# Patient Record
Sex: Male | Born: 1970 | Race: White | Hispanic: No | Marital: Single | Smoking: Never smoker
Health system: Southern US, Community
[De-identification: ages and names within clinical notes are randomized; demographics above are authoritative.]

## PROBLEM LIST (undated history)

## (undated) DIAGNOSIS — S0292XA Unspecified fracture of facial bones, initial encounter for closed fracture: Secondary | ICD-10-CM

## (undated) DIAGNOSIS — H547 Unspecified visual loss: Secondary | ICD-10-CM

---

## 2006-10-10 DIAGNOSIS — S0292XA Unspecified fracture of facial bones, initial encounter for closed fracture: Secondary | ICD-10-CM

## 2006-10-10 HISTORY — DX: Unspecified fracture of facial bones, initial encounter for closed fracture: S02.92XA

## 2007-06-22 ENCOUNTER — Encounter: Payer: Self-pay | Admitting: Emergency Medicine

## 2007-06-22 ENCOUNTER — Emergency Department (HOSPITAL_COMMUNITY): Admission: EM | Admit: 2007-06-22 | Discharge: 2007-06-22 | Payer: Self-pay | Admitting: Emergency Medicine
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
## 2013-08-24 ENCOUNTER — Emergency Department (HOSPITAL_COMMUNITY)
Admission: EM | Admit: 2013-08-24 | Discharge: 2013-08-24 | Discharge status: Against Medical Advice | Payer: Self-pay | Attending: Emergency Medicine | Admitting: Emergency Medicine

## 2013-08-24 ENCOUNTER — Encounter (HOSPITAL_COMMUNITY): Payer: Self-pay | Admitting: Emergency Medicine

## 2013-08-24 DIAGNOSIS — R220 Localized swelling, mass and lump, head: Secondary | ICD-10-CM | POA: Insufficient documentation

## 2013-08-24 DIAGNOSIS — Y9389 Activity, other specified: Secondary | ICD-10-CM | POA: Insufficient documentation

## 2013-08-24 DIAGNOSIS — Z8669 Personal history of other diseases of the nervous system and sense organs: Secondary | ICD-10-CM | POA: Insufficient documentation

## 2013-08-24 DIAGNOSIS — T63461A Toxic effect of venom of wasps, accidental (unintentional), initial encounter: Secondary | ICD-10-CM | POA: Insufficient documentation

## 2013-08-24 DIAGNOSIS — T6391XA Toxic effect of contact with unspecified venomous animal, accidental (unintentional), initial encounter: Secondary | ICD-10-CM | POA: Insufficient documentation

## 2013-08-24 DIAGNOSIS — Z8781 Personal history of (healed) traumatic fracture: Secondary | ICD-10-CM | POA: Insufficient documentation

## 2013-08-24 DIAGNOSIS — Y929 Unspecified place or not applicable: Secondary | ICD-10-CM | POA: Insufficient documentation

## 2013-08-24 HISTORY — DX: Unspecified visual loss: H54.7

## 2013-08-24 HISTORY — DX: Unspecified fracture of facial bones, initial encounter for closed fracture: S02.92XA

## 2013-08-24 MED ORDER — DIPHENHYDRAMINE HCL 50 MG/ML IJ SOLN
50.0000 mg | Freq: Once | INTRAMUSCULAR | Status: AC
  Administered 2013-08-24: 50 mg via INTRAVENOUS
  Filled 2013-08-24: qty 1

## 2013-08-24 MED ORDER — FAMOTIDINE IN NACL 20-0.9 MG/50ML-% IV SOLN
20.0000 mg | Freq: Once | INTRAVENOUS | Status: AC
  Administered 2013-08-24: 20 mg via INTRAVENOUS
  Filled 2013-08-24: qty 50

## 2013-08-24 MED ORDER — METHYLPREDNISOLONE SODIUM SUCC 125 MG IJ SOLR
125.0000 mg | Freq: Once | INTRAMUSCULAR | Status: AC
  Administered 2013-08-24: 125 mg via INTRAVENOUS
  Filled 2013-08-24: qty 2

## 2013-08-24 MED ORDER — PREDNISONE 20 MG PO TABS: 40.0000 mg | ORAL_TABLET | Freq: Every day | ORAL | Status: DC

## 2013-08-24 NOTE — ED Notes (Signed)
Stung be a ye;llow jacket, facial swelling

## 2013-08-24 NOTE — ED Provider Notes (Signed)
CSN: [id number]     Arrival date & time 08/24/13  1517 History   First MD Initiated Contact with Patient 08/24/13 1535     Chief Complaint  Patient presents with  . Insect Bite    HPI Pt was seen at 1535. Per pt, c/o sudden onset and resolution of one episode of "stung by a bee" on his left upper lip that occurred approx 1 hour PTA. Pt states he was outside cooking and talking on his cellphone when "a bee stung me on my left upper lip." States the swelling has spread to his entire upper lip and left facial cheek." Pt states he took OTC benadryl without improvement. Denies intra-oral edema, no hoarse voice, no drooling, no wheezing/stridor, no SOB/CP, no rash.     Past Medical History  Diagnosis Date  . Decreased vision     right eye  . Facial bones, closed fracture 2008    left tripod fracture    History reviewed. No pertinent past surgical history.  History  Substance Use Topics  . Smoking status: Never Smoker   . Smokeless tobacco: Not on file  . Alcohol Use: No    Review of Systems ROS: Statement: All systems negative except as marked or noted in the HPI; Constitutional: Negative for fever and chills. ; ; Eyes: Negative for eye pain, redness and discharge. ; ; ENMT: Negative for ear pain, hoarseness, nasal congestion, sinus pressure and sore throat. ; ; Cardiovascular: Negative for chest pain, palpitations, diaphoresis, dyspnea and peripheral edema. ; ; Respiratory: Negative for cough, wheezing and stridor. ; ; Gastrointestinal: Negative for nausea, vomiting, diarrhea, abdominal pain, blood in stool, hematemesis, jaundice and rectal bleeding. . ; ; Genitourinary: Negative for dysuria, flank pain and hematuria. ; ; Musculoskeletal: Negative for back pain and neck pain. Negative for swelling and trauma.; ; Skin: +left facial and upper lip swelling. Negative for pruritus, rash, abrasions, blisters, bruising and skin lesion.; ; Neuro: Negative for headache, lightheadedness and neck  stiffness. Negative for weakness, altered level of consciousness , altered mental status, extremity weakness, paresthesias, involuntary movement, seizure and syncope.       Allergies  Bee venom and Sulfa antibiotics  Home Medications  No current outpatient prescriptions on file. BP 156/99  Pulse 98  Temp(Src) 98.6 F (37 C) (Oral)  SpO2 96% Physical Exam: Physical examination:  Nursing notes reviewed; Vital signs and O2 SAT reviewed;  Constitutional: Well developed, Well nourished, Well hydrated, In no acute distress; Head:  Normocephalic, atraumatic; Eyes: EOMI, PERRL, No scleral icterus; ENMT: Mouth and pharynx normal, Mucous membranes moist. Mouth and pharynx without lesions. +entire upper lip and left facial cheek edema. No erythema, no rash, no open wounds. No tonsillar exudates. No intra-oral edema. No submandibular or sublingual edema. No hoarse voice, no drooling, no stridor. No pain with manipulation of larynx.;; Neck: Supple, Full range of motion, No lymphadenopathy; Cardiovascular: Regular rate and rhythm, No gallop; Respiratory: Breath sounds clear & equal bilaterally, No wheezes.  Speaking full sentences with ease, Normal respiratory effort/excursion; Chest: Nontender, Movement normal; Abdomen: Soft, Nontender, Nondistended, Normal bowel sounds; Genitourinary: No CVA tenderness; Extremities: Pulses normal, No tenderness, No edema, No calf edema or asymmetry.; Neuro: AA&Ox3, Major CN grossly intact.  Speech clear. Climbs on and off stretcher easily by himself. Gait steady. No gross focal motor or sensory deficits in extremities.; Skin: Color normal, Warm, Dry.    ED Course  Procedures   EKG Interpretation   None  MDM  MDM Reviewed: previous chart, nursing note and vitals      1815:  Pt does not want to stay in the ED any longer. States he "just has to go home now." Pt received meds approx 2 hours ago without much change in his facial and upper lip edema. No  intra-oral edema, no wheezing, no hoarse voice, no drooling/stridor. Has been ambulatory with steady gait, easy resps. Sats remain 98% R/A. Pt and family informed re: continued facial and lip edema and that I recommend continued ED observation for clinical improvement.  Pt refuses to stay.  I encouraged pt to stay, continues to refuse.  Pt makes his own medical decisions.  Risks of AMA explained to pt and family, including, but not limited to:  Intra-oral edema, airway compromise, stroke, heart attack, cardiac arrythmia ("irregular heart rate/beat"), "passing out," temporary and/or permanent disability, death.  Pt and family verb understanding and continue to refuse admission, understanding the consequences of their decision.  I encouraged pt to follow up with his PMD on Monday and return to the ED immediately if symptoms worsen, or for any other concerns.  Pt and family verb understanding, agreeable.   Laray Anger, DO 08/25/13 [phone]

## 2016-12-01 ENCOUNTER — Encounter (HOSPITAL_COMMUNITY): Payer: Self-pay | Admitting: Emergency Medicine

## 2016-12-01 ENCOUNTER — Emergency Department (HOSPITAL_COMMUNITY)
Admission: EM | Admit: 2016-12-01 | Discharge: 2016-12-01 | Disposition: A | Discharge status: Home / Self Care | Payer: Self-pay | Attending: Emergency Medicine | Admitting: Emergency Medicine

## 2016-12-01 DIAGNOSIS — L03116 Cellulitis of left lower limb: Secondary | ICD-10-CM | POA: Insufficient documentation

## 2016-12-01 DIAGNOSIS — Z7982 Long term (current) use of aspirin: Secondary | ICD-10-CM | POA: Insufficient documentation

## 2016-12-01 DIAGNOSIS — F17220 Nicotine dependence, chewing tobacco, uncomplicated: Secondary | ICD-10-CM | POA: Insufficient documentation

## 2016-12-01 DIAGNOSIS — Z79899 Other long term (current) drug therapy: Secondary | ICD-10-CM | POA: Insufficient documentation

## 2016-12-01 LAB — CBC WITH DIFFERENTIAL/PLATELET
Basophils Absolute: 0 10*3/uL (ref 0.0–0.1)
Basophils Relative: 0 %
Eosinophils Absolute: 0 10*3/uL (ref 0.0–0.7)
Eosinophils Relative: 0 %
HEMATOCRIT: 46.8 % (ref 39.0–52.0)
HEMOGLOBIN: 15.1 g/dL (ref 13.0–17.0)
LYMPHS PCT: 12 %
Lymphs Abs: 2.2 10*3/uL (ref 0.7–4.0)
MCH: 30.6 pg (ref 26.0–34.0)
MCHC: 32.3 g/dL (ref 30.0–36.0)
MCV: 94.7 fL (ref 78.0–100.0)
MONO ABS: 2 10*3/uL — AB (ref 0.1–1.0)
MONOS PCT: 11 %
NEUTROS ABS: 13.4 10*3/uL — AB (ref 1.7–7.7)
NEUTROS PCT: 77 %
Platelets: 184 10*3/uL (ref 150–400)
RBC: 4.94 MIL/uL (ref 4.22–5.81)
RDW: 13.3 % (ref 11.5–15.5)
WBC: 17.5 10*3/uL — ABNORMAL HIGH (ref 4.0–10.5)

## 2016-12-01 LAB — BASIC METABOLIC PANEL
ANION GAP: 8 (ref 5–15)
BUN: 15 mg/dL (ref 6–20)
CHLORIDE: 99 mmol/L — AB (ref 101–111)
CO2: 29 mmol/L (ref 22–32)
Calcium: 8.3 mg/dL — ABNORMAL LOW (ref 8.9–10.3)
Creatinine, Ser: 0.86 mg/dL (ref 0.61–1.24)
GFR calc Af Amer: >60 mL/min (ref >60)
GFR calc non Af Amer: >60 mL/min (ref >60)
GLUCOSE: 125 mg/dL — AB (ref 65–99)
POTASSIUM: 3.5 mmol/L (ref 3.5–5.1)
Sodium: 136 mmol/L (ref 135–145)

## 2016-12-01 MED ORDER — HYDROCODONE-ACETAMINOPHEN 5-325 MG PO TABS: 1.0000 | ORAL_TABLET | Freq: Four times a day (QID) | ORAL | 0 refills | Status: DC | PRN

## 2016-12-01 MED ORDER — DEXTROSE 5 % IV SOLN
1.0000 g | Freq: Once | INTRAVENOUS | Status: AC
  Administered 2016-12-01: 1 g via INTRAVENOUS
  Filled 2016-12-01: qty 10

## 2016-12-01 MED ORDER — MORPHINE SULFATE (PF) 4 MG/ML IV SOLN
4.0000 mg | Freq: Once | INTRAVENOUS | Status: AC
  Administered 2016-12-01: 4 mg via INTRAVENOUS
  Filled 2016-12-01: qty 1

## 2016-12-01 MED ORDER — ONDANSETRON HCL 4 MG/2ML IJ SOLN
4.0000 mg | Freq: Once | INTRAMUSCULAR | Status: AC
  Administered 2016-12-01: 4 mg via INTRAVENOUS
  Filled 2016-12-01: qty 2

## 2016-12-01 MED ORDER — SODIUM CHLORIDE 0.9 % IV BOLUS (SEPSIS)
1000.0000 mL | Freq: Once | INTRAVENOUS | Status: AC
  Administered 2016-12-01: 1000 mL via INTRAVENOUS

## 2016-12-01 MED ORDER — CEPHALEXIN 500 MG PO CAPS: 500.0000 mg | ORAL_CAPSULE | Freq: Four times a day (QID) | ORAL | 0 refills | Status: DC

## 2016-12-01 NOTE — ED Triage Notes (Signed)
Pt reports cellulitis in R leg x2 days and fever.  Pt has had this before, pt alert and oriented.
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
## 2016-12-01 NOTE — Discharge Instructions (Signed)
Keflex as prescribed.  Hydrocodone as prescribed as needed for pain.  Return to the emergency department if you develop fevers greater than 103, worsening pain, dizziness, difficulty breathing, or other new and concerning symptoms.

## 2016-12-10 ENCOUNTER — Encounter (HOSPITAL_COMMUNITY): Payer: Self-pay | Admitting: *Deleted

## 2016-12-10 ENCOUNTER — Inpatient Hospital Stay (HOSPITAL_COMMUNITY)
Admission: EM | Admit: 2016-12-10 | Discharge: 2016-12-16 | DRG: 580 | Disposition: A | Discharge status: Home Health | Payer: 59 | Attending: Internal Medicine | Admitting: Internal Medicine

## 2016-12-10 ENCOUNTER — Emergency Department (HOSPITAL_COMMUNITY): Payer: Self-pay

## 2016-12-10 DIAGNOSIS — Z6841 Body Mass Index (BMI) 40.0 and over, adult: Secondary | ICD-10-CM

## 2016-12-10 DIAGNOSIS — L02415 Cutaneous abscess of right lower limb: Principal | ICD-10-CM | POA: Diagnosis present

## 2016-12-10 DIAGNOSIS — E1165 Type 2 diabetes mellitus with hyperglycemia: Secondary | ICD-10-CM | POA: Diagnosis present

## 2016-12-10 DIAGNOSIS — D473 Essential (hemorrhagic) thrombocythemia: Secondary | ICD-10-CM | POA: Diagnosis present

## 2016-12-10 DIAGNOSIS — L0390 Cellulitis, unspecified: Secondary | ICD-10-CM | POA: Diagnosis present

## 2016-12-10 DIAGNOSIS — E662 Morbid (severe) obesity with alveolar hypoventilation: Secondary | ICD-10-CM | POA: Diagnosis present

## 2016-12-10 DIAGNOSIS — R739 Hyperglycemia, unspecified: Secondary | ICD-10-CM

## 2016-12-10 DIAGNOSIS — Z91030 Bee allergy status: Secondary | ICD-10-CM

## 2016-12-10 DIAGNOSIS — Z720 Tobacco use: Secondary | ICD-10-CM | POA: Diagnosis present

## 2016-12-10 DIAGNOSIS — L03115 Cellulitis of right lower limb: Secondary | ICD-10-CM | POA: Diagnosis present

## 2016-12-10 DIAGNOSIS — Z882 Allergy status to sulfonamides status: Secondary | ICD-10-CM

## 2016-12-10 DIAGNOSIS — G4733 Obstructive sleep apnea (adult) (pediatric): Secondary | ICD-10-CM | POA: Diagnosis present

## 2016-12-10 DIAGNOSIS — Z91038 Other insect allergy status: Secondary | ICD-10-CM

## 2016-12-10 DIAGNOSIS — H5461 Unqualified visual loss, right eye, normal vision left eye: Secondary | ICD-10-CM | POA: Diagnosis present

## 2016-12-10 DIAGNOSIS — M79661 Pain in right lower leg: Secondary | ICD-10-CM

## 2016-12-10 DIAGNOSIS — D75839 Thrombocytosis, unspecified: Secondary | ICD-10-CM | POA: Diagnosis present

## 2016-12-10 DIAGNOSIS — M609 Myositis, unspecified: Secondary | ICD-10-CM | POA: Diagnosis present

## 2016-12-10 DIAGNOSIS — R030 Elevated blood-pressure reading, without diagnosis of hypertension: Secondary | ICD-10-CM | POA: Diagnosis present

## 2016-12-10 DIAGNOSIS — M7989 Other specified soft tissue disorders: Secondary | ICD-10-CM

## 2016-12-10 LAB — CBC WITH DIFFERENTIAL/PLATELET
BASOS PCT: 0 %
Basophils Absolute: 0 10*3/uL (ref 0.0–0.1)
EOS PCT: 3 %
Eosinophils Absolute: 0.3 10*3/uL (ref 0.0–0.7)
HEMATOCRIT: 40.8 % (ref 39.0–52.0)
Hemoglobin: 12.8 g/dL — ABNORMAL LOW (ref 13.0–17.0)
LYMPHS PCT: 35 %
Lymphs Abs: 3.6 10*3/uL (ref 0.7–4.0)
MCH: 30.5 pg (ref 26.0–34.0)
MCHC: 31.4 g/dL (ref 30.0–36.0)
MCV: 97.4 fL (ref 78.0–100.0)
MONO ABS: 0.7 10*3/uL (ref 0.1–1.0)
MONOS PCT: 7 %
NEUTROS ABS: 5.8 10*3/uL (ref 1.7–7.7)
Neutrophils Relative %: 55 %
Platelets: 423 10*3/uL — ABNORMAL HIGH (ref 150–400)
RBC: 4.19 MIL/uL — ABNORMAL LOW (ref 4.22–5.81)
RDW: 13.4 % (ref 11.5–15.5)
WBC: 10.5 10*3/uL (ref 4.0–10.5)

## 2016-12-10 LAB — HEPATIC FUNCTION PANEL
ALT: 20 U/L (ref 17–63)
AST: 17 U/L (ref 15–41)
Albumin: 2.3 g/dL — ABNORMAL LOW (ref 3.5–5.0)
Alkaline Phosphatase: 66 U/L (ref 38–126)
BILIRUBIN TOTAL: 0.3 mg/dL (ref 0.3–1.2)
Total Protein: 7.3 g/dL (ref 6.5–8.1)

## 2016-12-10 LAB — BASIC METABOLIC PANEL
Anion gap: 7 (ref 5–15)
BUN: 13 mg/dL (ref 6–20)
CALCIUM: 8.5 mg/dL — AB (ref 8.9–10.3)
CO2: 31 mmol/L (ref 22–32)
CREATININE: 0.74 mg/dL (ref 0.61–1.24)
Chloride: 104 mmol/L (ref 101–111)
GFR calc Af Amer: >60 mL/min (ref >60)
GFR calc non Af Amer: >60 mL/min (ref >60)
GLUCOSE: 130 mg/dL — AB (ref 65–99)
Potassium: 4.2 mmol/L (ref 3.5–5.1)
Sodium: 142 mmol/L (ref 135–145)

## 2016-12-10 LAB — MRSA PCR SCREENING: MRSA by PCR: NEGATIVE

## 2016-12-10 MED ORDER — ONDANSETRON HCL 4 MG/2ML IJ SOLN: 4.0000 mg | Freq: Four times a day (QID) | INTRAMUSCULAR | Status: DC | PRN

## 2016-12-10 MED ORDER — CLINDAMYCIN PHOSPHATE 600 MG/50ML IV SOLN
600.0000 mg | Freq: Three times a day (TID) | INTRAVENOUS | Status: DC
  Administered 2016-12-10 – 2016-12-11 (×2): 600 mg via INTRAVENOUS
  Filled 2016-12-10 (×6): qty 50

## 2016-12-10 MED ORDER — ACETAMINOPHEN 325 MG PO TABS: 650.0000 mg | ORAL_TABLET | Freq: Four times a day (QID) | ORAL | Status: DC | PRN

## 2016-12-10 MED ORDER — FUROSEMIDE 10 MG/ML IJ SOLN
40.0000 mg | Freq: Once | INTRAMUSCULAR | Status: AC
  Administered 2016-12-10: 40 mg via INTRAVENOUS
  Filled 2016-12-10 (×2): qty 4

## 2016-12-10 MED ORDER — ONDANSETRON HCL 4 MG PO TABS: 4.0000 mg | ORAL_TABLET | Freq: Four times a day (QID) | ORAL | Status: DC | PRN

## 2016-12-10 MED ORDER — ACETAMINOPHEN 325 MG PO TABS
650.0000 mg | ORAL_TABLET | Freq: Once | ORAL | Status: AC
  Administered 2016-12-10: 650 mg via ORAL
  Filled 2016-12-10: qty 2

## 2016-12-10 MED ORDER — ACETAMINOPHEN 650 MG RE SUPP: 650.0000 mg | Freq: Four times a day (QID) | RECTAL | Status: DC | PRN

## 2016-12-10 MED ORDER — POVIDONE-IODINE 10 % EX SOLN
CUTANEOUS | Status: AC
  Filled 2016-12-10: qty 118

## 2016-12-10 MED ORDER — FUROSEMIDE 10 MG/ML IJ SOLN
40.0000 mg | Freq: Two times a day (BID) | INTRAMUSCULAR | Status: DC
  Administered 2016-12-10 – 2016-12-12 (×5): 40 mg via INTRAVENOUS
  Filled 2016-12-10 (×5): qty 4

## 2016-12-10 MED ORDER — VANCOMYCIN HCL IN DEXTROSE 1-5 GM/200ML-% IV SOLN
1000.0000 mg | Freq: Once | INTRAVENOUS | Status: AC
  Administered 2016-12-10: 1000 mg via INTRAVENOUS
  Filled 2016-12-10: qty 200

## 2016-12-10 MED ORDER — POTASSIUM CHLORIDE CRYS ER 20 MEQ PO TBCR
20.0000 meq | EXTENDED_RELEASE_TABLET | Freq: Two times a day (BID) | ORAL | Status: DC
  Administered 2016-12-10 – 2016-12-15 (×10): 20 meq via ORAL
  Filled 2016-12-10 (×10): qty 1

## 2016-12-10 MED ORDER — ZOLPIDEM TARTRATE 5 MG PO TABS
5.0000 mg | ORAL_TABLET | Freq: Every evening | ORAL | Status: DC | PRN
  Administered 2016-12-10 – 2016-12-12 (×3): 5 mg via ORAL
  Filled 2016-12-10 (×3): qty 1

## 2016-12-10 MED ORDER — IBUPROFEN 600 MG PO TABS
600.0000 mg | ORAL_TABLET | Freq: Four times a day (QID) | ORAL | Status: DC | PRN
  Administered 2016-12-14 – 2016-12-15 (×3): 600 mg via ORAL
  Filled 2016-12-10 (×3): qty 1

## 2016-12-10 MED ORDER — ENOXAPARIN SODIUM 40 MG/0.4ML ~~LOC~~ SOLN: 40.0000 mg | SUBCUTANEOUS | Status: DC

## 2016-12-10 MED ORDER — FUROSEMIDE 10 MG/ML IJ SOLN: 60.0000 mg | Freq: Once | INTRAMUSCULAR | Status: DC

## 2016-12-10 MED ORDER — ENOXAPARIN SODIUM 80 MG/0.8ML ~~LOC~~ SOLN
80.0000 mg | SUBCUTANEOUS | Status: DC
  Administered 2016-12-10 – 2016-12-13 (×4): 80 mg via SUBCUTANEOUS
  Filled 2016-12-10 (×4): qty 0.8

## 2016-12-10 NOTE — ED Triage Notes (Signed)
Pt was seen here on 2/22 for cellulitis. He finished has antibiotics yesterday but continues to have right lower leg redness and swelling. Denies any pain.

## 2016-12-10 NOTE — Progress Notes (Signed)
Pharmacy Clarification  Adjusted lovenox for DVT pxl dose to 0.5 mg sq q24h for patient with BMI >30.  Plan:  Lovenox 80 mg sq q24h  [place] PharmD BCPS 12/10/2016 6:00 PM

## 2016-12-10 NOTE — ED Provider Notes (Signed)
[place] DEPT Provider Note   CSN: [id number] Arrival date & time: 12/10/16  1207  By signing my name below, I, [name], attest that this documentation has been prepared under the direction and in the presence of Milton Ferguson, MD. Electronically Signed: [name], ED Scribe. 12/10/16. 12:24 PM.  History   Chief Complaint Chief Complaint  Patient presents with  . Wound Check   The history is provided by the patient and medical records. No language interpreter was used.  Wound Check  This is a recurrent problem. The current episode started more than 1 week ago. The problem occurs constantly. The problem has been gradually worsening. Pertinent negatives include no chest pain, no abdominal pain, no headaches and no shortness of breath. Exacerbated by: palpation over the area. Nothing relieves the symptoms. Treatments tried: Keflex. The treatment provided no relief.    HPI Comments: Leslie Atkinson is a 46 y.o. male who presents to the Emergency Department complaining of a moderate, gradually worsening area of pain and swelling to the right lower leg onset ten days ago. Pt has a prior h/o lower extremity cellulitis and states that his symptoms today are consistent with this. Per prior chart review, pt was seen in the ED on 12/01/16 (~9 days ago) for this issue and at that time he was given his first dose of Rocephin via IV at that time. He was prescribed a course of Keflex which he began one day following his ED visit which he has been taking compliantly without relief of his symptoms. Pt states pain is exacerbated with palpation and direct pressure. Denies fever, chills, or any other associated symptoms.   Past Medical History:  Diagnosis Date  . Decreased vision    right eye  . Facial bones, closed fracture ([place]) 2008   left tripod fracture   There are no active problems to display for this patient.  History reviewed. No pertinent surgical history.  Home  Medications    Prior to Admission medications   Medication Sig Start Date End Date Taking? Authorizing Provider  Aspirin-Acetaminophen-Caffeine (GOODY HEADACHE PO) Take 1 packet by mouth daily as needed (for pain).    Historical Provider, MD  cephALEXin (KEFLEX) 500 MG capsule Take 1 capsule (500 mg total) by mouth 4 (four) times daily. 12/01/16   Veryl Speak, MD  HYDROcodone-acetaminophen (NORCO/VICODIN) 5-325 MG tablet Take 1-2 tablets by mouth every 6 (six) hours as needed. 12/01/16   Veryl Speak, MD  ibuprofen (ADVIL,MOTRIN) 200 MG tablet Take 800 mg by mouth every 6 (six) hours as needed.    Historical Provider, MD   Family History No family history on file.  Social History Social History  Substance Use Topics  . Smoking status: Never Smoker  . Smokeless tobacco: Current User    Types: Chew  . Alcohol use No   Allergies   Yellow jacket venom [bee venom] and Sulfa antibiotics  Review of Systems Review of Systems  Constitutional: Negative for fever.  Respiratory: Negative for shortness of breath.   Cardiovascular: Negative for chest pain.  Gastrointestinal: Negative for abdominal pain.  Musculoskeletal: Positive for myalgias.  Skin: Positive for color change.  Neurological: Negative for headaches.  All other systems reviewed and are negative.  Physical Exam Updated Vital Signs BP (!) 162/114 (BP Location: Left Arm)   Pulse 82   Temp 97.6 F (36.4 C) (Oral)   Resp 18   Ht 5\' 7"  (1.702 m)   Wt (!) 325 lb (147.4 kg)  SpO2 98%   BMI 50.90 kg/m   Physical Exam  Constitutional: He is oriented to person, place, and time. He appears well-developed.  HENT:  Head: Normocephalic.  Eyes: Conjunctivae and EOM are normal. No scleral icterus.  Neck: Neck supple. No thyromegaly present.  Cardiovascular: Normal rate and regular rhythm.  Exam reveals no gallop and no friction rub.   No murmur heard. Pulmonary/Chest: No stridor. He has no wheezes. He has no rales. He exhibits  no tenderness.  Abdominal: He exhibits no distension. There is no tenderness. There is no rebound.  Musculoskeletal: Normal range of motion. He exhibits tenderness. He exhibits no edema.  Lymphadenopathy:    He has no cervical adenopathy.  Neurological: He is oriented to person, place, and time. He exhibits normal muscle tone. Coordination normal.  Skin: No rash noted. There is erythema.  Swelling, redness, and tenderness distal to the right knee.   Psychiatric: He has a normal mood and affect. His behavior is normal.   ED Treatments / Results  DIAGNOSTIC STUDIES: Oxygen Saturation is 98% on RA, normal by my interpretation.   COORDINATION OF CARE: 12:21 PM-Discussed next steps with pt. Pt verbalized understanding and is agreeable with the plan.   Labs (all labs ordered are listed, but only abnormal results are displayed) Labs Reviewed  CBC WITH DIFFERENTIAL/PLATELET  BASIC METABOLIC PANEL   EKG  EKG Interpretation None      Radiology No results found.  Procedures Procedures   Medications Ordered in ED Medications - No data to display  Initial Impression / Assessment and Plan / ED Course  I have reviewed the triage vital signs and the nursing notes.  Pertinent labs & imaging results that were available during my care of the patient were reviewed by me and considered in my medical decision making (see chart for details).     Patient will be admitted for probable cellulitis right lower leg  Final Clinical Impressions(s) / ED Diagnoses   Final diagnoses:  None   New Prescriptions New Prescriptions   No medications on file   The chart was scribed for me under my direct supervision.  I personally performed the history, physical, and medical decision making and all procedures in the evaluation of this patient.Milton Ferguson, MD 12/10/16 [phone]

## 2016-12-10 NOTE — ED Notes (Signed)
Hospitalist at bedside 

## 2016-12-10 NOTE — ED Notes (Signed)
Attempted to call report, RN unavailable.

## 2016-12-10 NOTE — H&P (Signed)
History and Physical  Leslie Atkinson [id number] DOB: [birth year] DOA: 12/10/2016  Referring physician: Dr Dewayne Hatch, ED physician PCP: No PCP Per Patient  Outpatient Specialists: none  Patient Coming From: home  Chief Complaint: leg swelling and redness  HPI: Leslie Atkinson is a 46 y.o. male with a history of obesity presents to the hospital for follow-up of cellulitis. Patient was seen on 2/22 and diagnosed with cellulitis. Patient was prescribed Keflex and sent home. Patient states that his leg continues to be sore and swollen and red up until 2 days ago. He finished antibiotics at that time and his pain had decreased, although he continued to have swelling and redness. He does report that the skin on the anterior shin created a blister only clear fluid. He denies fevers, chills, nausea, vomiting. Currently, the only pain that he has is in the skin pretibially where the skin has peeled off. Resting helps with the swelling, however this increases rheumatically the patient is standing. There is no other red areas. He has been taking ibuprofen and Lortab to help with the pain.  Emergency Department Course: Patient given dose of vancomycin in the emergency department. White count 10. afebrile.  Review of Systems:   Pt denies any fevers, chills, nausea, vomiting, diarrhea, constipation, abdominal pain, shortness of breath, dyspnea on exertion, orthopnea, cough, wheezing, palpitations, headache, vision changes, lightheadedness, dizziness, melena, rectal bleeding.  Review of systems are otherwise negative  Past Medical History:  Diagnosis Date  . Decreased vision    right eye  . Facial bones, closed fracture ([place]) 2008   left tripod fracture   History reviewed. No pertinent surgical history. Social History:  reports that he has never smoked. His smokeless tobacco use includes Chew. He reports that he does not drink alcohol or use drugs. Patient lives at [place]  . Yellow Jacket Venom [Bee Venom] Swelling    Facial swelling (severe)  . Sulfa Antibiotics Hives    No family history on file.  Patient uncertain of family history.  Prior to Admission medications   Medication Sig Start Date End Date Taking? Authorizing Provider  ibuprofen (ADVIL,MOTRIN) 200 MG tablet Take 800 mg by mouth every 6 (six) hours as needed.   Yes Historical Provider, MD  cephALEXin (KEFLEX) 500 MG capsule Take 1 capsule (500 mg total) by mouth 4 (four) times daily. Patient not taking: Reported on 12/10/2016 12/01/16   Veryl Speak, MD  HYDROcodone-acetaminophen (NORCO/VICODIN) 5-325 MG tablet Take 1-2 tablets by mouth every 6 (six) hours as needed. Patient not taking: Reported on 12/10/2016 12/01/16   Veryl Speak, MD    Physical Exam: BP 153/97   Pulse 86   Temp 97.6 F (36.4 C) (Oral)   Resp 18   Ht 5\' 7"  (1.702 m)   Wt (!) 147.4 kg (325 lb)   SpO2 92%   BMI 50.90 kg/m   General: Middle-aged Caucasian male who appears stated age. Awake and alert and oriented x3. No acute cardiopulmonary distress.  HEENT: Normocephalic atraumatic.  Right and left ears normal in appearance.  Pupils equal, round, reactive to light. Extraocular muscles are intact. Sclerae anicteric and noninjected.  Moist mucosal membranes. No mucosal lesions.  Neck: Neck supple without lymphadenopathy. No carotid bruits. No masses palpated.  Cardiovascular: Regular rate with normal S1-S2 sounds. No murmurs, rubs, gallops auscultated. No JVD.  Respiratory: Good respiratory effort with no wheezes, rales, rhonchi. Lungs clear to auscultation bilaterally.  No accessory muscle use. Abdomen: Obese. Soft,  nontender, nondistended. Active bowel sounds. No masses or hepatosplenomegaly. 4 cm nontender umbilical hernia  Skin: Majority of the patient's lower leg from 4 cm below the patella to the ankle is erythemic, more so pretibially. There is a 6 x 4 cm area of pretibial skin that has peeled away. This area  is not dramatically warmer than the surrounding non-erythemic areas. Homans negative. No calf tenderness. 2+ dorsalis pedis and radial pulses. Musculoskeletal: No calf or leg pain. All major joints not erythematous nontender.  No upper or lower joint deformation.  Good ROM.  No contractures  Psychiatric: Intact judgment and insight. Pleasant and cooperative. Neurologic: No focal neurological deficits. Strength is 5/5 and symmetric in upper and lower extremities.  Cranial nerves II through XII are grossly intact.           Labs on Admission: I have personally reviewed following labs and imaging studies  CBC:  Recent Labs Lab 12/10/16 1220  WBC 10.5  NEUTROABS 5.8  HGB 12.8*  HCT 40.8  MCV 97.4  PLT 99991111*   Basic Metabolic Panel:  Recent Labs Lab 12/10/16 1220  NA 142  K 4.2  CL 104  CO2 31  GLUCOSE 130*  BUN 13  CREATININE 0.74  CALCIUM 8.5*   GFR: Estimated Creatinine Clearance: 162.6 mL/min (by C-G formula based on SCr of 0.74 mg/dL). Liver Function Tests:  Recent Labs Lab 12/10/16 1220  AST 17  ALT 20  ALKPHOS 66  BILITOT 0.3  PROT 7.3  ALBUMIN 2.3*   No results for input(s): LIPASE, AMYLASE in the last 168 hours. No results for input(s): AMMONIA in the last 168 hours. Coagulation Profile: No results for input(s): INR, PROTIME in the last 168 hours. Cardiac Enzymes: No results for input(s): CKTOTAL, CKMB, CKMBINDEX, TROPONINI in the last 168 hours. BNP (last 3 results) No results for input(s): PROBNP in the last 8760 hours. HbA1C: No results for input(s): HGBA1C in the last 72 hours. CBG: No results for input(s): GLUCAP in the last 168 hours. Lipid Profile: No results for input(s): CHOL, HDL, LDLCALC, TRIG, CHOLHDL, LDLDIRECT in the last 72 hours. Thyroid Function Tests: No results for input(s): TSH, T4TOTAL, FREET4, T3FREE, THYROIDAB in the last 72 hours. Anemia Panel: No results for input(s): VITAMINB12, FOLATE, FERRITIN, TIBC, IRON, RETICCTPCT in  the last 72 hours. Urine analysis: No results found for: COLORURINE, APPEARANCEUR, LABSPEC, PHURINE, GLUCOSEU, HGBUR, BILIRUBINUR, KETONESUR, PROTEINUR, UROBILINOGEN, NITRITE, LEUKOCYTESUR Sepsis Labs: @LABRCNTIP (procalcitonin:4,lacticidven:4) )No results found for this or any previous visit (from the past 240 hour(s)).   Radiological Exams on Admission: Dg Tibia/fibula Right  Result Date: 12/10/2016 CLINICAL DATA:  Right lower leg redness and swelling. EXAM: RIGHT TIBIA AND FIBULA - 2 VIEW COMPARISON:  None. FINDINGS: There is no evidence of fracture or other focal bone lesions. Soft tissues are unremarkable. IMPRESSION: Normal right tibia and fibula. Electronically Signed   By: Marijo Conception, M.D.   On: 12/10/2016 15:58     Assessment/Plan: Active Problems:   Cellulitis   Elevated blood pressure reading   Elevated blood sugar    This patient was discussed with the ED physician, including pertinent vitals, physical exam findings, labs, and imaging.  We also discussed care given by the ED provider.  #1 cellulitis  Observation  Other the patient received dose of vancomycin, will place the patient on clindamycin as the patient does not have MRSA risk factors and there is no purulent discharge or fluctuant areas.  We will swab the patient for MRSA  Not entirely convinced that the patient's cellulitis is dramatically worse than on the 22nd. I do think that the patient has a component of dependent edema and that the erythema may represent chronic venous stasis changes.  We'll give the patient a dose of Lasix 40 mg IV twice a day  Repeat CBC and CMP in the morning  Check HIV #2 elevated blood pressure  We'll watch for now, although may need to start antihypertensives. #3 elevated blood sugar  Check hemoglobin A1c  DVT prophylaxis: Lovenox Consultants: None Code Status: Full code Family Communication: Daughter in the room  Disposition Plan: Likely discharge  tomorrow   Truett Mainland, DO [facility] Pager [phone]  If 7PM-7AM, please contact night-coverage [email or web address] Password [email or web address]

## 2016-12-11 ENCOUNTER — Observation Stay (HOSPITAL_COMMUNITY): Payer: Self-pay

## 2016-12-11 DIAGNOSIS — D75839 Thrombocytosis, unspecified: Secondary | ICD-10-CM | POA: Diagnosis present

## 2016-12-11 DIAGNOSIS — Z720 Tobacco use: Secondary | ICD-10-CM

## 2016-12-11 DIAGNOSIS — L03115 Cellulitis of right lower limb: Secondary | ICD-10-CM | POA: Diagnosis present

## 2016-12-11 DIAGNOSIS — D473 Essential (hemorrhagic) thrombocythemia: Secondary | ICD-10-CM

## 2016-12-11 LAB — BASIC METABOLIC PANEL
Anion gap: 8 (ref 5–15)
BUN: 12 mg/dL (ref 6–20)
CHLORIDE: 97 mmol/L — AB (ref 101–111)
CO2: 35 mmol/L — AB (ref 22–32)
Calcium: 8.6 mg/dL — ABNORMAL LOW (ref 8.9–10.3)
Creatinine, Ser: 0.82 mg/dL (ref 0.61–1.24)
GFR calc Af Amer: >60 mL/min (ref >60)
GFR calc non Af Amer: >60 mL/min (ref >60)
Glucose, Bld: 113 mg/dL — ABNORMAL HIGH (ref 65–99)
POTASSIUM: 4.4 mmol/L (ref 3.5–5.1)
SODIUM: 140 mmol/L (ref 135–145)

## 2016-12-11 LAB — CBC
HEMATOCRIT: 43 % (ref 39.0–52.0)
HEMOGLOBIN: 13.4 g/dL (ref 13.0–17.0)
MCH: 30.7 pg (ref 26.0–34.0)
MCHC: 31.2 g/dL (ref 30.0–36.0)
MCV: 98.4 fL (ref 78.0–100.0)
Platelets: 456 10*3/uL — ABNORMAL HIGH (ref 150–400)
RBC: 4.37 MIL/uL (ref 4.22–5.81)
RDW: 13.3 % (ref 11.5–15.5)
WBC: 12.9 10*3/uL — ABNORMAL HIGH (ref 4.0–10.5)

## 2016-12-11 MED ORDER — HYDROCODONE-ACETAMINOPHEN 5-325 MG PO TABS
1.0000 | ORAL_TABLET | Freq: Four times a day (QID) | ORAL | Status: DC | PRN
  Administered 2016-12-11 – 2016-12-14 (×6): 1 via ORAL
  Filled 2016-12-11 (×5): qty 1

## 2016-12-11 MED ORDER — PIPERACILLIN-TAZOBACTAM 3.375 G IVPB
3.3750 g | Freq: Three times a day (TID) | INTRAVENOUS | Status: DC
  Administered 2016-12-11 – 2016-12-15 (×13): 3.375 g via INTRAVENOUS
  Filled 2016-12-11 (×17): qty 50

## 2016-12-11 MED ORDER — VANCOMYCIN HCL 10 G IV SOLR
1500.0000 mg | Freq: Three times a day (TID) | INTRAVENOUS | Status: DC
  Administered 2016-12-11 – 2016-12-13 (×6): 1500 mg via INTRAVENOUS
  Filled 2016-12-11 (×10): qty 1500

## 2016-12-11 MED ORDER — IOPAMIDOL (ISOVUE-300) INJECTION 61%
100.0000 mL | Freq: Once | INTRAVENOUS | Status: AC | PRN
  Administered 2016-12-11: 100 mL via INTRAVENOUS

## 2016-12-11 MED ORDER — TRAMADOL HCL 50 MG PO TABS
50.0000 mg | ORAL_TABLET | Freq: Once | ORAL | Status: AC
Start: 2016-12-11 — End: 2016-12-11
  Administered 2016-12-11: 50 mg via ORAL
  Filled 2016-12-11: qty 1

## 2016-12-11 NOTE — Progress Notes (Signed)
Pharmacy Antibiotic Note  Leslie Atkinson is a 46 y.o. male admitted on 12/10/2016 with cellulitis.  Pharmacy has been consulted for Vancomycin and zosyn dosing.  Plan: Vancomycin 1500mg   IV every 8 hours.  Goal trough 10-15 mcg/mL. Zosyn 3.375g IV q8h (4 hour infusion).  F/U cxs and clinical progress Monitor V/S, labs, and levels as indicated  Height: 5\' 7"  (170.2 cm) Weight: (!) 361 lb 11.2 oz (164.1 kg) IBW/kg (Calculated) : 66.1  Temp (24hrs), Avg:97.7 F (36.5 C), Min:97.6 F (36.4 C), Max:98 F (36.7 C)   Recent Labs Lab 12/10/16 1220 12/11/16 0550  WBC 10.5 12.9*  CREATININE 0.74 0.82    Normalized CrCl is 182ml/min Estimated Creatinine Clearance: 169.4 mL/min (by C-G formula based on SCr of 0.82 mg/dL).    Allergies  Allergen Reactions  . [place] Venom [Bee Venom] Swelling    Facial swelling (severe)  . Sulfa Antibiotics Hives    Antimicrobials this admission: Vancomycin 3/3(x 1 dose in ED) reinitiated 3/4 >>  Zosyn 3/4 >>  Clindamycin 3/3>>3/4  Microbiology results: 3/3 MRSA PCR: negative  Thank you for allowing pharmacy to be a part of this patient's care.  Isac Sarna, BS Pharm D, [place] Clinical Pharmacist Pager [phone] 12/11/2016 9:28 AM

## 2016-12-11 NOTE — Progress Notes (Signed)
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 Patient was seen on 2/22 and diagnosed with cellulitis of the Right Leg. Patient was prescribed Keflex and sent home. Patient states that his leg continues to be sore and swollen and red up until 2 days ago. He finished antibiotics at that time and his pain had decreased, although he continued to have swelling and redness. He does report that the skin on the anterior shin created a blister only clear fluid. He denies fevers, chills, nausea, vomiting. Currently, the only pain that he has is in the skin pretibially where the skin has peeled off. Resting helps with the swelling, however this increases dramatically the patient is standing. There is no other red areas. He has been taking ibuprofen and Lortab to help with the pain with some relief. Was admitted for Right Leg Cellulitis. Abx were broadened to Vancomycin and IV Zosyn and a CT of the Leg was ordered to evaluate.   Assessment & Plan:   Principal Problem:   Cellulitis of right anterior lower leg Active Problems:   Cellulitis   Elevated blood pressure reading   Elevated blood sugar   Thrombocytosis (HCC)  Right Lower Leg Anterior Cellulitis with Blistering -s/p Keflex treatment as an outpatient wit failed Treatment;  -D/C'd Clindamycin and started IV Vancomycin and IV Zosyn; Significantly blistering -MRSA swab pending -IV Lasix 40 mg BID for Edema today and will reassess in Am; On po KCl 20 mEQ po BID while on IV Lasix -WBC went from 10.5 -> 12.9; WBC on 12/01/16 was 17.5 -Ordered CT Scan of Right Lower Leg with Contrast -C/w Zofran 4 mg po/IV q6hprn for N/V -X-Ray of Tibia/Fibula showed There is no evidence of fracture or other focal bone lesions. Soft tissues are  unremarkable  -Pain Control with Hydrocodone-Acetaminophen 5-325 mg po 1 tab po q6hprn and Advil 600 mg po q6hprn -Has Hx of Cellulits in same leg 5 years ago  Hyperglycemia -Blood Sugars on BMP's have been elevated ranging from 113-130 -Will check Hemoglobin A1c  Thrombocytosis  -Likely Reactive -Was normal on 2/22 at 184; Went from 423 -> 456 yesterday -Continue to Monitor and repeat CBC in AM  Elevated Blood Pressure -Likely reactive to Pain; BP was 149/98 -On IV Lasix 40 mg BID -May consider adding Amlodipine 5 mg po Daily if remains elevated -Will add IV Hydralazine as Necessary for SBP >180 or DBP >105 -Continue to Monitor BP's   Tobacco Abuse -Patient uses Dip and was found doing it in the hospital -Counseled on the dangers of using Smokeless Tobacco  DVT prophylaxis: Lovenox 80 mg sq daily Code Status: FULL CODE Family Communication: No Family Present at bedside Disposition Plan: Made Inpatietn as patient failed Abx Treatment ans an outpatient and slow to respond; Remain Inpatient and Anticipated D/C in 48-72 Hours  Consultants:  None   Procedures:  CT of Right Leg ordered   Antimicrobials:  Anti-infectives    Start     Dose/Rate Route Frequency Ordered Stop   12/11/16 1000  piperacillin-tazobactam (ZOSYN) IVPB 3.375 g     3.375 g 12.5 mL/hr over 240 Minutes Intravenous Every 8 hours 12/11/16 0925     12/11/16 1000  vancomycin (VANCOCIN) 1,500 mg in sodium chloride 0.9 % 500 mL IVPB  1,500 mg 250 mL/hr over 120 Minutes Intravenous Every 8 hours 12/11/16 0928     12/10/16 2200  clindamycin (CLEOCIN) IVPB 600 mg  Status:  Discontinued     600 mg 100 mL/hr over 30 Minutes Intravenous Every 8 hours 12/10/16 1754 12/11/16 0903   12/10/16 1230  vancomycin (VANCOCIN) IVPB 1000 mg/200 mL premix     1,000 mg 200 mL/hr over 60 Minutes Intravenous  Once 12/10/16 1222 12/10/16 1424     Subjective: Seen and examined at bedside and states he was having a lot of  pain in Right Lower Extremity. States it seems more red and painful. No nausea or vomiting. No CP or SOB and had no other concerns or complaints at this time.   Objective: Vitals:   12/10/16 1714 12/10/16 1754 12/10/16 2041 12/11/16 0609  BP:  (!) 160/88 (!) 168/99 (!) 149/98  Pulse: 79 67 86 87  Resp:  18 18 18   Temp:  97.7 F (36.5 C) 97.6 F (36.4 C) 98 F (36.7 C)  TempSrc:  Oral Oral Oral  SpO2: 94% 98% 95% 95%  Weight:  (!) 164.1 kg (361 lb 11.2 oz)    Height:  5\' 7"  (1.702 m)      Intake/Output Summary (Last 24 hours) at 12/11/16 1311 Last data filed at 12/11/16 0900  Gross per 24 hour  Intake              540 ml  Output                0 ml  Net              540 ml   Filed Weights   12/10/16 1212 12/10/16 1754  Weight: (!) 147.4 kg (325 lb) (!) 164.1 kg (361 lb 11.2 oz)   Examination: Physical Exam:  Constitutional: WN/WD, obese NAD and appears calm and comfortable Eyes: Llids and conjunctivae normal, sclerae anicteric  ENMT: External Ears, Nose appear normal. Grossly normal hearing.  Neck: Appears normal, supple, no cervical masses, normal ROM, no appreciable thyromegaly Respiratory: Clear to auscultation bilaterally, no wheezing, rales, rhonchi or crackles. Normal respiratory effort and patient is not tachypenic. No accessory muscle use.  Cardiovascular: RRR, no murmurs / rubs / gallops. S1 and S2 auscultated. 1+ Lower Extremity edema in the Right Leg with significant erythema and blistering Abdomen: Soft, non-tender, distended 2/2 body habitus. No masses palpated. No appreciable hepatosplenomegaly. Bowel sounds positive x4.  GU: Deferred. Musculoskeletal: No clubbing / cyanosis of digits/nails. No joint deformity upper and lower extremities.  Skin: Significant Erythema and Warmth with blistering on Right Anterior Shin and 1+ Edema; No other rashes, lesions noted.  Neurologic: CN 2-12 grossly intact with no focal deficits. Sensation intact in all 4. Romberg sign  cerebellar reflexes not assessed.  Psychiatric: Normal judgment and insight. Alert and oriented x 3. Normal mood and appropriate affect.   Data Reviewed: I have personally reviewed following labs and imaging studies  CBC:  Recent Labs Lab 12/10/16 1220 12/11/16 0550  WBC 10.5 12.9*  NEUTROABS 5.8  --   HGB 12.8* 13.4  HCT 40.8 43.0  MCV 97.4 98.4  PLT 423* 99991111*   Basic Metabolic Panel:  Recent Labs Lab 12/10/16 1220 12/11/16 0550  NA 142 140  K 4.2 4.4  CL 104 97*  CO2 31 35*  GLUCOSE 130* 113*  BUN 13 12  CREATININE 0.74 0.82  CALCIUM 8.5* 8.6*   GFR: Estimated Creatinine Clearance: 169.4 mL/min (by C-G formula  based on SCr of 0.82 mg/dL). Liver Function Tests:  Recent Labs Lab 12/10/16 1220  AST 17  ALT 20  ALKPHOS 66  BILITOT 0.3  PROT 7.3  ALBUMIN 2.3*   No results for input(s): LIPASE, AMYLASE in the last 168 hours. No results for input(s): AMMONIA in the last 168 hours. Coagulation Profile: No results for input(s): INR, PROTIME in the last 168 hours. Cardiac Enzymes: No results for input(s): CKTOTAL, CKMB, CKMBINDEX, TROPONINI in the last 168 hours. BNP (last 3 results) No results for input(s): PROBNP in the last 8760 hours. HbA1C: No results for input(s): HGBA1C in the last 72 hours. CBG: No results for input(s): GLUCAP in the last 168 hours. Lipid Profile: No results for input(s): CHOL, HDL, LDLCALC, TRIG, CHOLHDL, LDLDIRECT in the last 72 hours. Thyroid Function Tests: No results for input(s): TSH, T4TOTAL, FREET4, T3FREE, THYROIDAB in the last 72 hours. Anemia Panel: No results for input(s): VITAMINB12, FOLATE, FERRITIN, TIBC, IRON, RETICCTPCT in the last 72 hours. Sepsis Labs: No results for input(s): PROCALCITON, LATICACIDVEN in the last 168 hours.  Recent Results (from the past 240 hour(s))  MRSA PCR Screening     Status: None   Collection Time: 12/10/16  1:03 PM  Result Value Ref Range Status   MRSA by PCR NEGATIVE NEGATIVE Final      Comment:        The GeneXpert MRSA Assay (FDA approved for NASAL specimens only), is one component of a comprehensive MRSA colonization surveillance program. It is not intended to diagnose MRSA infection nor to guide or monitor treatment for MRSA infections.     Radiology Studies: Dg Tibia/fibula Right  Result Date: 12/10/2016 CLINICAL DATA:  Right lower leg redness and swelling. EXAM: RIGHT TIBIA AND FIBULA - 2 VIEW COMPARISON:  None. FINDINGS: There is no evidence of fracture or other focal bone lesions. Soft tissues are unremarkable. IMPRESSION: Normal right tibia and fibula. Electronically Signed   By: Marijo Conception, M.D.   On: 12/10/2016 15:58   Scheduled Meds: . enoxaparin (LOVENOX) injection  80 mg Subcutaneous Q24H  . furosemide  40 mg Intravenous BID  . piperacillin-tazobactam (ZOSYN)  IV  3.375 g Intravenous Q8H  . potassium chloride  20 mEq Oral BID  . vancomycin  1,500 mg Intravenous Q8H   Continuous Infusions:   LOS: 0 days   Kerney Elbe, DO [facility] Pager [phone]  If 7PM-7AM, please contact night-coverage [email or web address] Password [email or web address] 12/11/2016, 1:11 PM

## 2016-12-12 DIAGNOSIS — L02415 Cutaneous abscess of right lower limb: Secondary | ICD-10-CM | POA: Diagnosis present

## 2016-12-12 DIAGNOSIS — L03115 Cellulitis of right lower limb: Secondary | ICD-10-CM

## 2016-12-12 LAB — CBC WITH DIFFERENTIAL/PLATELET
Basophils Absolute: 0 10*3/uL (ref 0.0–0.1)
Basophils Relative: 0 %
EOS ABS: 0.2 10*3/uL (ref 0.0–0.7)
EOS PCT: 2 %
HCT: 44.2 % (ref 39.0–52.0)
Hemoglobin: 13.8 g/dL (ref 13.0–17.0)
LYMPHS ABS: 3.1 10*3/uL (ref 0.7–4.0)
Lymphocytes Relative: 31 %
MCH: 30.7 pg (ref 26.0–34.0)
MCHC: 31.2 g/dL (ref 30.0–36.0)
MCV: 98.4 fL (ref 78.0–100.0)
MONO ABS: 0.8 10*3/uL (ref 0.1–1.0)
MONOS PCT: 8 %
Neutro Abs: 5.8 10*3/uL (ref 1.7–7.7)
Neutrophils Relative %: 59 %
PLATELETS: 484 10*3/uL — AB (ref 150–400)
RBC: 4.49 MIL/uL (ref 4.22–5.81)
RDW: 13.4 % (ref 11.5–15.5)
WBC: 9.9 10*3/uL (ref 4.0–10.5)

## 2016-12-12 LAB — COMPREHENSIVE METABOLIC PANEL
ALK PHOS: 58 U/L (ref 38–126)
ALT: 16 U/L — ABNORMAL LOW (ref 17–63)
ANION GAP: 9 (ref 5–15)
AST: 16 U/L (ref 15–41)
Albumin: 2.4 g/dL — ABNORMAL LOW (ref 3.5–5.0)
BILIRUBIN TOTAL: 0.4 mg/dL (ref 0.3–1.2)
BUN: 13 mg/dL (ref 6–20)
CALCIUM: 8.9 mg/dL (ref 8.9–10.3)
CO2: 32 mmol/L (ref 22–32)
Chloride: 97 mmol/L — ABNORMAL LOW (ref 101–111)
Creatinine, Ser: 0.77 mg/dL (ref 0.61–1.24)
Glucose, Bld: 111 mg/dL — ABNORMAL HIGH (ref 65–99)
POTASSIUM: 4.3 mmol/L (ref 3.5–5.1)
Sodium: 138 mmol/L (ref 135–145)
TOTAL PROTEIN: 7.6 g/dL (ref 6.5–8.1)

## 2016-12-12 LAB — HIV ANTIBODY (ROUTINE TESTING W REFLEX): HIV SCREEN 4TH GENERATION: NONREACTIVE

## 2016-12-12 LAB — PHOSPHORUS: Phosphorus: 5.4 mg/dL — ABNORMAL HIGH (ref 2.5–4.6)

## 2016-12-12 LAB — MAGNESIUM: MAGNESIUM: 2 mg/dL (ref 1.7–2.4)

## 2016-12-12 LAB — HEMOGLOBIN A1C
Hgb A1c MFr Bld: 6 % — ABNORMAL HIGH (ref 4.8–5.6)
Mean Plasma Glucose: 126 mg/dL

## 2016-12-12 MED ORDER — TRAMADOL HCL 50 MG PO TABS
50.0000 mg | ORAL_TABLET | Freq: Four times a day (QID) | ORAL | Status: DC | PRN
  Administered 2016-12-13 – 2016-12-14 (×2): 50 mg via ORAL
  Filled 2016-12-12 (×2): qty 1

## 2016-12-12 MED ORDER — MORPHINE SULFATE (PF) 2 MG/ML IV SOLN
1.0000 mg | INTRAVENOUS | Status: DC | PRN
  Administered 2016-12-12 (×3): 1 mg via INTRAVENOUS
  Filled 2016-12-12 (×3): qty 1

## 2016-12-12 NOTE — Progress Notes (Signed)
PROGRESS NOTE    Leslie Atkinson  [id number] DOB: 01-23-1971 DOA: 12/10/2016 PCP: No PCP Per Patient   Brief Narrative:  Leslie Atkinson is a 46 y.o. 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 Patient was seen on 2/22 and diagnosed with cellulitis of the Right Leg. Patient was prescribed Keflex and sent home. Patient states that his leg continues to be sore and swollen and red up until 2 days ago. He finished antibiotics at that time and his pain had decreased, although he continued to have swelling and redness. He does report that the skin on the anterior shin created a blister only clear fluid. He denies fevers, chills, nausea, vomiting. Currently, the only pain that he has is in the skin pretibially where the skin has peeled off. Resting helps with the swelling, however this increases dramatically the patient is standing. There is no other red areas. He has been taking ibuprofen and Lortab to help with the pain with some relief. Was admitted for Right Leg Cellulitis. Abx were broadened to Vancomycin and IV Zosyn and a CT of the Leg was ordered to evaluate. CT of the Right leg showed diffuse cellulitis and myofacitis along with a large fluid collection (14 x 2.5 x5 cm) along the anteromedial aspect of the tibia worrisome for an abscess. Spoke with Orthopedic Surgery Dr. Erlinda Hong and recommended transfer to [facility] as there was no Orthopedic Coverage here. Discussed with patient and updated him about plan.   Assessment & Plan:   Principal Problem:   Cellulitis of right anterior lower leg Active Problems:   Cellulitis   Elevated blood pressure reading   Elevated blood sugar   Thrombocytosis (HCC)   Tobacco abuse  Right Lower Leg Anterior Cellulitis and Myofascitis with Blistering -s/p Keflex treatment as an outpatient wit failed Treatment;  -D/C'd Clindamycin and started IV Vancomycin and IV Zosyn; Significantly  blistering -C/w IV Vancomycin and IV Zosyn -MRSA swab pending -C/w IV Lasix 40 mg BID for Edema today and will reassess in Am; On po KCl 20 mEQ po BID while on IV Lasix -WBC went from 10.5 -> 12.9 -> 9.9; WBC on 12/01/16 was 17.5 -Ordered CT Scan of Right Lower Leg with Contrast and showed diffuse cellulitis and myofascitis along with a large fluid collection (14 x 2.5 x 5.0) along the anteromedial aspect of the tibia worrisome for Abscess -C/w Zofran 4 mg po/IV q6hprn for N/V -X-Ray of Tibia/Fibula showed There is no evidence of fracture or other focal bone lesions. Soft tissues are unremarkable  -Pain Control with Hydrocodone-Acetaminophen 5-325 mg po 1 tab po q6hprn and Advil 600 mg po q6hprn -Added Tramadol 50 mg po q6hprn and IV Morphine 1 mg q4hprn for better Pain control -Has Hx of Cellulits in same leg 5 years ago -Will transfer to [facility] for evaluation by Orthopedics Dr. Erlinda Hong as I spoke with Dr. Erlinda Hong  ? Right Anteromedial Tibial Abscess in the setting of Right Leg Cellulitis  -? Trauma as cause -CT of Right Leg showed There is also a long fluid collection along the anterior aspect of the mid tibia worrisome for an abscess. This measures 14 x 2.5 x 5.0 cm. -Disscussed with Dr. Erlinda Hong of Orthopedics and plan is to transfer to [facility] for Evaluation as there is no Orthopedic Coverage here today  Hyperglycemia/Impaired Fasting Glucose -Blood Sugars on BMP's have been elevated ranging from 111-130 -Hemoglobin A1c was 6.0 -Continue  with Heart Healthy/Carb Modified Diet  Thrombocytosis  -Likely Reactive in the setting of Infection -Was normal on 2/22 at 184; Went from 423 -> 456 -> 484 -Continue to Monitor and repeat CBC in AM  Elevated Blood Pressure -Likely reactive to Pain; BP was 149/98 yesterday and improved to 136/87 today -On IV Lasix 40 mg BID; Consider D/C'ing in AM -May consider adding Amlodipine 5 mg po Daily if remains elevated -Will add IV Hydralazine as  Necessary for SBP >180 or DBP >105 -Continue to Monitor BP's   Tobacco Abuse -Patient uses Dip and was found doing it in the hospital -Counseled on the dangers of using Smokeless Tobacco  DVT prophylaxis: Lovenox 80 mg sq daily Code Status: FULL CODE Family Communication: No Family Present at bedside Disposition Plan: Transfer to [facility] for Orthopedic Evaluation and  Anticipated D/C in 48-72 Hours if medically stable  Consultants:  -[facility] Orthopedic Surgery Dr. Erlinda Hong   Procedures:  CT of Right Leg    Antimicrobials:  Anti-infectives    Start     Dose/Rate Route Frequency Ordered Stop   12/11/16 1000  piperacillin-tazobactam (ZOSYN) IVPB 3.375 g     3.375 g 12.5 mL/hr over 240 Minutes Intravenous Every 8 hours 12/11/16 0925     12/11/16 1000  vancomycin (VANCOCIN) 1,500 mg in sodium chloride 0.9 % 500 mL IVPB     1,500 mg 250 mL/hr over 120 Minutes Intravenous Every 8 hours 12/11/16 0928     12/10/16 2200  clindamycin (CLEOCIN) IVPB 600 mg  Status:  Discontinued     600 mg 100 mL/hr over 30 Minutes Intravenous Every 8 hours 12/10/16 1754 12/11/16 0903   12/10/16 1230  vancomycin (VANCOCIN) IVPB 1000 mg/200 mL premix     1,000 mg 200 mL/hr over 60 Minutes Intravenous  Once 12/10/16 1222 12/10/16 1424     Subjective: Seen and examined at bedside and states he had a good night but was still in significant leg pain and stated his leg felt like it was "burning." No Nausea or vomiting. No CP or SOB. Thinks leg swelling is improving. Accidentally bumped his leg into the wall when ambulating to the restroom yesterday and thinks he caused more pain. Discussed plan about transfer to [facility] for Orthopedic Evaluation and patient understood and agreed.    Objective: Vitals:   12/11/16 0609 12/11/16 1425 12/11/16 2048 12/12/16 0529  BP: (!) 149/98 (!) 148/102 125/77 136/87  Pulse: 87 80 74 78  Resp: 18 18 18 18   Temp: 98 F (36.7 C) 97.9 F (36.6 C) 98.3 F (36.8 C) 98.2 F  (36.8 C)  TempSrc: Oral Oral Oral Oral  SpO2: 95% 94% 96% 95%  Weight:      Height:        Intake/Output Summary (Last 24 hours) at 12/12/16 0745 Last data filed at 12/11/16 1700  Gross per 24 hour  Intake             1270 ml  Output                0 ml  Net             1270 ml   Filed Weights   12/10/16 1212 12/10/16 1754  Weight: (!) 147.4 kg (325 lb) (!) 164.1 kg (361 lb 11.2 oz)   Examination: Physical Exam:  Constitutional: WN/WD, obese NAD and appears calm and comfortable Eyes: Llids and conjunctivae normal, sclerae anicteric  ENMT: External Ears, Nose appear normal. Grossly  normal hearing.  Neck: Appears normal, supple, no cervical masses, normal ROM, no appreciable thyromegaly Respiratory: Clear to auscultation bilaterally, no wheezing, rales, rhonchi or crackles. Normal respiratory effort and patient is not tachypenic. No accessory muscle use.  Cardiovascular: RRR, no murmurs / rubs / gallops. S1 and S2 auscultated. 1+ Lower Extremity edema in the Right Leg with less erythema but continued blistering Abdomen: Soft, non-tender, distended 2/2 body habitus. No masses palpated. No appreciable hepatosplenomegaly. Bowel sounds positive x4. Large umbilical hernia noted.  GU: Deferred. Musculoskeletal: No clubbing / cyanosis of digits/nails. No joint deformity upper and lower extremities.  Skin: Significant Erythema and Warmth with blistering on Right Anterior Shin and 1+ Edema; No other rashes, lesions noted.  Neurologic: CN 2-12 grossly intact with no focal deficits. Sensation intact in all 4. Romberg sign cerebellar reflexes not assessed.  Psychiatric: Normal judgment and insight. Alert and oriented x 3. Normal mood and appropriate affect.   Data Reviewed: I have personally reviewed following labs and imaging studies  CBC:  Recent Labs Lab 12/10/16 1220 12/11/16 0550 12/12/16 0510  WBC 10.5 12.9* 9.9  NEUTROABS 5.8  --  5.8  HGB 12.8* 13.4 13.8  HCT 40.8 43.0 44.2   MCV 97.4 98.4 98.4  PLT 423* 456* [id number]*   Basic Metabolic Panel:  Recent Labs Lab 12/10/16 1220 12/11/16 0550 12/12/16 0510  NA 142 140 138  K 4.2 4.4 4.3  CL 104 97* 97*  CO2 31 35* 32  GLUCOSE 130* 113* 111*  BUN 13 12 13   CREATININE 0.74 0.82 0.77  CALCIUM 8.5* 8.6* 8.9  MG  --   --  2.0  PHOS  --   --  5.4*   GFR: Estimated Creatinine Clearance: 173.7 mL/min (by C-G formula based on SCr of 0.77 mg/dL). Liver Function Tests:  Recent Labs Lab 12/10/16 1220 12/12/16 0510  AST 17 16  ALT 20 16*  ALKPHOS 66 58  BILITOT 0.3 0.4  PROT 7.3 7.6  ALBUMIN 2.3* 2.4*   No results for input(s): LIPASE, AMYLASE in the last 168 hours. No results for input(s): AMMONIA in the last 168 hours. Coagulation Profile: No results for input(s): INR, PROTIME in the last 168 hours. Cardiac Enzymes: No results for input(s): CKTOTAL, CKMB, CKMBINDEX, TROPONINI in the last 168 hours. BNP (last 3 results) No results for input(s): PROBNP in the last 8760 hours. HbA1C:  Recent Labs  12/10/16 1830  HGBA1C 6.0*   CBG: No results for input(s): GLUCAP in the last 168 hours. Lipid Profile: No results for input(s): CHOL, HDL, LDLCALC, TRIG, CHOLHDL, LDLDIRECT in the last 72 hours. Thyroid Function Tests: No results for input(s): TSH, T4TOTAL, FREET4, T3FREE, THYROIDAB in the last 72 hours. Anemia Panel: No results for input(s): VITAMINB12, FOLATE, FERRITIN, TIBC, IRON, RETICCTPCT in the last 72 hours. Sepsis Labs: No results for input(s): PROCALCITON, LATICACIDVEN in the last 168 hours.  Recent Results (from the past 240 hour(s))  MRSA PCR Screening     Status: None   Collection Time: 12/10/16  1:03 PM  Result Value Ref Range Status   MRSA by PCR NEGATIVE NEGATIVE Final    Comment:        The GeneXpert MRSA Assay (FDA approved for NASAL specimens only), is one component of a comprehensive MRSA colonization surveillance program. It is not intended to diagnose MRSA infection nor  to guide or monitor treatment for MRSA infections.     Radiology Studies: Dg Tibia/fibula Right  Result Date: 12/10/2016 CLINICAL DATA:  Right lower leg redness and swelling. EXAM: RIGHT TIBIA AND FIBULA - 2 VIEW COMPARISON:  None. FINDINGS: There is no evidence of fracture or other focal bone lesions. Soft tissues are unremarkable. IMPRESSION: Normal right tibia and fibula. Electronically Signed   By: Marijo Conception, M.D.   On: 12/10/2016 15:58   Ct Tibia Fibula Right W Contrast  Result Date: 12/11/2016 CLINICAL DATA:  Right lower extremity pain and swelling for 10 days. EXAM: CT OF THE LOWER RIGHT EXTREMITY WITH CONTRAST TECHNIQUE: Multidetector CT imaging of the lower right extremity was performed according to the standard protocol following intravenous contrast administration. COMPARISON:  Radiographs 12/10/2016 CONTRAST:  168mL ISOVUE-300 IOPAMIDOL (ISOVUE-300) INJECTION 61% FINDINGS: Diffuse subcutaneous soft tissue swelling/ edema/fluid along with skin thickening consistent with cellulitis. There is also a long fluid collection along the anterior aspect of the mid tibia worrisome for an abscess. This measures 14 x 2.5 x 5.0 cm. Suspect underlying myofasciitis but no definite findings for pyomyositis. The knee and ankle joints are grossly normal. No findings suspicious for septic arthritis. No destructive bony changes to suggest osteomyelitis. The major vascular structures appear patent. IMPRESSION: 1. Diffuse cellulitis and myofasciitis. 2. Large fluid collection along the anteromedial aspect of the tibia worrisome for abscess. 3. No findings to suggest septic arthritis or osteomyelitis or pyomyositis. Electronically Signed   By: Marijo Sanes M.D.   On: 12/11/2016 13:48   Scheduled Meds: . enoxaparin (LOVENOX) injection  80 mg Subcutaneous Q24H  . furosemide  40 mg Intravenous BID  . piperacillin-tazobactam (ZOSYN)  IV  3.375 g Intravenous Q8H  . potassium chloride  20 mEq Oral BID  .  vancomycin  1,500 mg Intravenous Q8H   Continuous Infusions:   LOS: 1 day   Kerney Elbe, DO [facility] Pager [phone]  If 7PM-7AM, please contact night-coverage [email or web address] Password [email or web address] 12/12/2016, 7:45 AM

## 2016-12-13 DIAGNOSIS — R7303 Prediabetes: Secondary | ICD-10-CM

## 2016-12-13 LAB — CBC WITH DIFFERENTIAL/PLATELET
Basophils Absolute: 0.1 10*3/uL (ref 0.0–0.1)
Basophils Relative: 1 %
EOS PCT: 3 %
Eosinophils Absolute: 0.4 10*3/uL (ref 0.0–0.7)
HCT: 48.1 % (ref 39.0–52.0)
HEMOGLOBIN: 15.5 g/dL (ref 13.0–17.0)
LYMPHS ABS: 3.2 10*3/uL (ref 0.7–4.0)
Lymphocytes Relative: 30 %
MCH: 31.3 pg (ref 26.0–34.0)
MCHC: 32.2 g/dL (ref 30.0–36.0)
MCV: 97.2 fL (ref 78.0–100.0)
MONOS PCT: 5 %
Monocytes Absolute: 0.6 10*3/uL (ref 0.1–1.0)
NEUTROS PCT: 61 %
Neutro Abs: 6.3 10*3/uL (ref 1.7–7.7)
Platelets: 476 10*3/uL — ABNORMAL HIGH (ref 150–400)
RBC: 4.95 MIL/uL (ref 4.22–5.81)
RDW: 13.3 % (ref 11.5–15.5)
WBC: 10.4 10*3/uL (ref 4.0–10.5)

## 2016-12-13 LAB — COMPREHENSIVE METABOLIC PANEL
ALT: 26 U/L (ref 17–63)
AST: 35 U/L (ref 15–41)
Albumin: 2.8 g/dL — ABNORMAL LOW (ref 3.5–5.0)
Alkaline Phosphatase: 62 U/L (ref 38–126)
Anion gap: 10 (ref 5–15)
BILIRUBIN TOTAL: 0.5 mg/dL (ref 0.3–1.2)
BUN: 18 mg/dL (ref 6–20)
CALCIUM: 9.1 mg/dL (ref 8.9–10.3)
CO2: 32 mmol/L (ref 22–32)
CREATININE: 0.81 mg/dL (ref 0.61–1.24)
Chloride: 94 mmol/L — ABNORMAL LOW (ref 101–111)
Glucose, Bld: 102 mg/dL — ABNORMAL HIGH (ref 65–99)
Potassium: 4.5 mmol/L (ref 3.5–5.1)
Sodium: 136 mmol/L (ref 135–145)
Total Protein: 8.4 g/dL — ABNORMAL HIGH (ref 6.5–8.1)

## 2016-12-13 LAB — VANCOMYCIN, TROUGH: VANCOMYCIN TR: 28 ug/mL — AB (ref 15–20)

## 2016-12-13 LAB — HEMOGLOBIN A1C
Hgb A1c MFr Bld: 6 % — ABNORMAL HIGH (ref 4.8–5.6)
Mean Plasma Glucose: 126 mg/dL

## 2016-12-13 LAB — PHOSPHORUS: PHOSPHORUS: 5 mg/dL — AB (ref 2.5–4.6)

## 2016-12-13 LAB — MAGNESIUM: Magnesium: 2 mg/dL (ref 1.7–2.4)

## 2016-12-13 MED ORDER — VANCOMYCIN HCL 10 G IV SOLR
1500.0000 mg | Freq: Two times a day (BID) | INTRAVENOUS | Status: DC
  Administered 2016-12-13 – 2016-12-15 (×4): 1500 mg via INTRAVENOUS
  Filled 2016-12-13 (×8): qty 1500

## 2016-12-13 NOTE — Progress Notes (Signed)
Patient has been mostly sleeping, more pleasant. Patient has not mentioned transfer, nor spoke negative about hospital or staff.

## 2016-12-13 NOTE — Progress Notes (Signed)
Patient slightly irritable since 1900 about pending transfer to [facility]. Patient heard on telephone multiple times complaining about this. Patient stating that he himself could stick a needle in his leg and drain abscess, so he didn't understand why Orthopedic surgeon was needed. This RN attempted to explain situation. Patient was cooperative with assessment and with taking meds. At approximately 2315, Kristi, Charge RN received call from bed placement that bed was available at [facility]. This RN informed patient of this at approximately 2330. Patient had already received Ambien and Vicodin PO. Patient became very upset with attempted transfer. Patient stated he would go, but prefered not to. Patient asked if he had a choice. After speaking with Steffanie Dunn, Agricultural consultant and Lamar Blinks, NP, this RN explained to the patient that he was not being forced to transfer but that [facility] could not hold his bed, possibly delaying his treatment and prolonging his hospitalization. Patient repeatedly stated that being transferred in the middle of the night was ridiculous and wrong. Patient stated that was going to wait for the next bed available. Patient repeatedly stating that he is going to call a lawyer and wanted to speak to someone within high management for the hospital in the Am. Patient stated that he did not want to speak to the doctor or anyone tonight because then he would most likely be leaving tonight. Patient then stated that he would go tonight but continued to talk about talking to lawyer. This RN reminded patient that he was not being forced. Patient then stated that he was not going to transfer until the morning, and stated that he will never come back to [place], Charge RN and [place], [facility] aware. Will notify oncoming RN in AM to contact proper management when they arrive to hospital.

## 2016-12-13 NOTE — Progress Notes (Signed)
Pharmacy Antibiotic Note  Leslie Atkinson is a 46 y.o. male admitted on 12/10/2016 with cellulitis.  Pharmacy has been consulted for Vancomycin and zosyn dosing.  Plan: Decrease Vancomycin 1500mg   IV every 12 hours. Goal trough 10-15 mcg/mL. Continue Zosyn 3.375g IV q8h (4 hour infusion).  F/U cxs and clinical progress Continue to monitor V/S, labs, and levels as indicated  Height: 5\' 7"  (170.2 cm) Weight: (!) 361 lb 11.2 oz (164.1 kg) IBW/kg (Calculated) : 66.1  Temp (24hrs), Avg:97.9 F (36.6 C), Min:97.5 F (36.4 C), Max:98.3 F (36.8 C)   Recent Labs Lab 12/10/16 1220 12/11/16 0550 12/12/16 0510 12/13/16 0920  WBC 10.5 12.9* 9.9 10.4  CREATININE 0.74 0.82 0.77 0.81  VANCOTROUGH  --   --   --  28*    Normalized CrCl is 185ml/min Estimated Creatinine Clearance: 171.5 mL/min (by C-G formula based on SCr of 0.81 mg/dL).    Allergies  Allergen Reactions  . [place]et Venom [Bee Venom] Swelling    Facial swelling (severe)  . Sulfa Antibiotics Hives    Antimicrobials this admission: Vancomycin 3/3(x 1 dose in ED) reinitiated 3/4 >>  Zosyn 3/4 >>  Clindamycin 3/3>>3/4  Microbiology results: 3/3 MRSA PCR: negative  Dose adjustments this admission:  Vancomycin adjusted 3/6 for elevated trough.  Thank you for allowing pharmacy to be a part of this patient's care.  [place], [place], [facility]  12/13/2016 10:47 AM

## 2016-12-13 NOTE — Progress Notes (Signed)
Received patient from [facility] via [place].  Patient AOx4, ambulatory, VS stable and pain 7/10 at right lower leg cellulitis.  Administered PRN pain medication Tramadol 50 mg PO per order.  Will monitor.

## 2016-12-13 NOTE — Progress Notes (Signed)
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 Patient was seen on 2/22 and diagnosed with cellulitis of the Right Leg. Patient was prescribed Keflex and sent home. Patient states that his leg continues to be sore and swollen and red up until 2 days ago. He finished antibiotics at that time and his pain had decreased, although he continued to have swelling and redness. He does report that the skin on the anterior shin created a blister only clear fluid. He denies fevers, chills, nausea, vomiting. Currently, the only pain that he has is in the skin pretibially where the skin has peeled off. Resting helps with the swelling, however this increases dramatically the patient is standing. There is no other red areas. He has been taking ibuprofen and Lortab to help with the pain with some relief. Was admitted for Right Leg Cellulitis. Abx were broadened to Vancomycin and IV Zosyn and a CT of the Leg was ordered to evaluate. CT of the Right leg showed diffuse cellulitis and myofacitis along with a large fluid collection (14 x 2.5 x5 cm) along the anteromedial aspect of the tibia worrisome for an abscess. Spoke with Orthopedic Surgery Dr. Erlinda Hong and recommended transfer to [facility] as there was no Orthopedic Coverage here. Discussed with patient and updated him about plan and bed was available late last night and early this AM and patient refused transfer because he did not want his sleep disturbed. Discussed with him about importance of being evaluated for suspected abscess and he understood and will request another bed as patient is now agreeable for transfer.   Assessment & Plan:   Principal Problem:   Cellulitis and abscess of right leg Active Problems:   Cellulitis   Elevated blood pressure  reading   Elevated blood sugar   Cellulitis of right anterior lower leg   Thrombocytosis (HCC)   Tobacco abuse   Abscess of leg, right  Right Lower Leg Anterior Cellulitis and Myofascitis with Blistering, improving -s/p Keflex treatment as an outpatient wit failed Treatment;  -D/C'd Clindamycin and started IV Vancomycin and IV Zosyn; Significantly blistering but improving  -C/w IV Vancomycin and IV Zosyn -MRSA swab pending -D/C'd IV Lasix 40 mg BID; Was on po KCl 20 mEQ po BID while on IV Lasix -WBC went from 10.5 -> 12.9 -> 9.9 -> 10.4; WBC on 12/01/16 was 17.5 -Ordered CT Scan of Right Lower Leg with Contrast and showed diffuse cellulitis and myofascitis along with a large fluid collection (14 x 2.5 x 5.0) along the anteromedial aspect of the tibia worrisome for Abscess -C/w Zofran 4 mg po/IV q6hprn for N/V -X-Ray of Tibia/Fibula showed There is no evidence of fracture or other focal bone lesions. Soft tissues are unremarkable  -Pain Control with Hydrocodone-Acetaminophen 5-325 mg po 1 tab po q6hprn, Advil 600 mg po q6hprn,Tramadol 50 mg po q6hprn and IV Morphine 1 mg q4hprn for better Pain control -Has Hx of Cellulits in same leg 5 years ago -Will transfer to [facility] for evaluation by Orthopedics Dr. Erlinda Hong as I spoke with Dr. Erlinda Hong yesterday 12/12/16 -Patient Refused Transfer last night because he had just gotten settled in bed but agreeable to Transfer this Am; Bed Request sent and pending bed Availability   ? Right Anteromedial Tibial  Abscess in the setting of Right Leg Cellulitis  -? Trauma as cause -CT of Right Leg showed There is also a long fluid collection along the anterior aspect of the mid tibia worrisome for an abscess. This measures 14 x 2.5 x 5.0 cm. -Disscussed with Dr. Erlinda Hong of Orthopedics and plan is to transfer to [facility] for Evaluation as there is no Orthopedic Coverage here for the week  Hyperglycemia/Impaired Fasting Glucose -Blood Sugars on BMP's have been  elevated ranging from 102-130 -Hemoglobin A1c was 6.0 -Continue with Heart Healthy/Carb Modified Diet  Thrombocytosis  -Likely Reactive in the setting of Infection -Was normal on 2/22 at 184; Went from 423 -> 456 -> 484 -> 476 -Continue to Monitor and repeat CBC in AM  Elevated Blood Pressure -Likely reactive to Pain; BP was 149/98 yesterday and improved to 136/87 today -On IV Lasix 40 mg BID; Consider D/C'ing in AM -May consider adding Amlodipine 5 mg po Daily if remains elevated -Will add IV Hydralazine as Necessary for SBP >180 or DBP >105 -Continue to Monitor BP's   Tobacco Abuse -Patient uses Dip and was found doing it in the hospital -Counseled on the dangers of using Smokeless Tobacco  DVT prophylaxis: Lovenox 80 mg sq daily Code Status: FULL CODE Family Communication: No Family Present at bedside Disposition Plan: Transfer to [facility] for Orthopedic Evaluation and Anticipated D/C in 48-72 Hours if medically stable  Consultants:  -[facility] Orthopedic Surgery Dr. Erlinda Hong   Procedures:  CT of Right Leg    Antimicrobials:  Anti-infectives    Start     Dose/Rate Route Frequency Ordered Stop   12/13/16 1800  vancomycin (VANCOCIN) 1,500 mg in sodium chloride 0.9 % 500 mL IVPB     1,500 mg 250 mL/hr over 120 Minutes Intravenous Every 12 hours 12/13/16 1046     12/11/16 1000  piperacillin-tazobactam (ZOSYN) IVPB 3.375 g     3.375 g 12.5 mL/hr over 240 Minutes Intravenous Every 8 hours 12/11/16 0925     12/11/16 1000  vancomycin (VANCOCIN) 1,500 mg in sodium chloride 0.9 % 500 mL IVPB  Status:  Discontinued     1,500 mg 250 mL/hr over 120 Minutes Intravenous Every 8 hours 12/11/16 0928 12/13/16 1046   12/10/16 2200  clindamycin (CLEOCIN) IVPB 600 mg  Status:  Discontinued     600 mg 100 mL/hr over 30 Minutes Intravenous Every 8 hours 12/10/16 1754 12/11/16 0903   12/10/16 1230  vancomycin (VANCOCIN) IVPB 1000 mg/200 mL premix     1,000 mg 200 mL/hr over 60 Minutes  Intravenous  Once 12/10/16 1222 12/10/16 1424     Subjective: Seen and examined at bedside and was going to be transferred late last night but refused because "he didn't think it was right to transfer that late at night" and wanted to sleep. Discussed with him that it caused a delay in care and then he understood and is agreeable to transfer today no matter what time it is. Thinks he is getting better and states leg is less erythematous and warm. No nausea or vomiting. No other concerns or complaints at this time.     Objective: Vitals:   12/12/16 0529 12/12/16 1446 12/12/16 2151 12/13/16 0446  BP: 136/87 (!) 148/94 117/69 (!) 142/88  Pulse: 78 77 81 77  Resp: 18 20 20 20   Temp: 98.2 F (36.8 C) 97.9 F (36.6 C) 98.3 F (36.8 C) 97.5 F (36.4 C)  TempSrc: Oral Oral Oral Oral  SpO2: 95% 95%  94% 97%  Weight:      Height:        Intake/Output Summary (Last 24 hours) at 12/13/16 1207 Last data filed at 12/13/16 0700  Gross per 24 hour  Intake             1785 ml  Output                0 ml  Net             1785 ml   Filed Weights   12/10/16 1212 12/10/16 1754  Weight: (!) 147.4 kg (325 lb) (!) 164.1 kg (361 lb 11.2 oz)   Examination: Physical Exam:  Constitutional: WN/WD, obese NAD and appears calm and comfortable Eyes: Llids and conjunctivae normal, sclerae anicteric  ENMT: External Ears, Nose appear normal. Grossly normal hearing.  Neck: Appears normal, supple, no cervical masses, normal ROM, no appreciable thyromegaly Respiratory: Clear to auscultation bilaterally, no wheezing, rales, rhonchi or crackles. Normal respiratory effort and patient is not tachypenic. No accessory muscle use.  Cardiovascular: RRR, no murmurs / rubs / gallops. S1 and S2 auscultated. 1+ Lower Extremity edema in the Right Leg with less erythema but continued blistering; Less Eyrthema Abdomen: Soft, non-tender, distended 2/2 body habitus. No masses palpated. No appreciable hepatosplenomegaly. Bowel  sounds positive x4. Large umbilical hernia noted.  GU: Deferred. Musculoskeletal: No clubbing / cyanosis of digits/nails. No joint deformity upper and lower extremities.  Skin: Improved Erythema and Warmth with some blistering on Right Anterior Shin and 1+ Edema; No other rashes, lesions noted.  Neurologic: CN 2-12 grossly intact with no focal deficits. Sensation intact in all 4. Romberg sign cerebellar reflexes not assessed.  Psychiatric: Normal judgment and insight. Alert and oriented x 3. Normal mood and appropriate affect.   Data Reviewed: I have personally reviewed following labs and imaging studies  CBC:  Recent Labs Lab 12/10/16 1220 12/11/16 0550 12/12/16 0510 12/13/16 0920  WBC 10.5 12.9* 9.9 10.4  NEUTROABS 5.8  --  5.8 6.3  HGB 12.8* 13.4 13.8 15.5  HCT 40.8 43.0 44.2 48.1  MCV 97.4 98.4 98.4 97.2  PLT 423* 456* 484* 0000000*   Basic Metabolic Panel:  Recent Labs Lab 12/10/16 1220 12/11/16 0550 12/12/16 0510 12/13/16 0920  NA 142 140 138 136  K 4.2 4.4 4.3 4.5  CL 104 97* 97* 94*  CO2 31 35* 32 32  GLUCOSE 130* 113* 111* 102*  BUN 13 12 13 18   CREATININE 0.74 0.82 0.77 0.81  CALCIUM 8.5* 8.6* 8.9 9.1  MG  --   --  2.0 2.0  PHOS  --   --  5.4* 5.0*   GFR: Estimated Creatinine Clearance: 171.5 mL/min (by C-G formula based on SCr of 0.81 mg/dL). Liver Function Tests:  Recent Labs Lab 12/10/16 1220 12/12/16 0510 12/13/16 0920  AST 17 16 35  ALT 20 16* 26  ALKPHOS 66 58 62  BILITOT 0.3 0.4 0.5  PROT 7.3 7.6 8.4*  ALBUMIN 2.3* 2.4* 2.8*   No results for input(s): LIPASE, AMYLASE in the last 168 hours. No results for input(s): AMMONIA in the last 168 hours. Coagulation Profile: No results for input(s): INR, PROTIME in the last 168 hours. Cardiac Enzymes: No results for input(s): CKTOTAL, CKMB, CKMBINDEX, TROPONINI in the last 168 hours. BNP (last 3 results) No results for input(s): PROBNP in the last 8760 hours. HbA1C:  Recent Labs  12/10/16 1830  12/12/16 0510  HGBA1C 6.0* 6.0*   CBG: No results for  input(s): GLUCAP in the last 168 hours. Lipid Profile: No results for input(s): CHOL, HDL, LDLCALC, TRIG, CHOLHDL, LDLDIRECT in the last 72 hours. Thyroid Function Tests: No results for input(s): TSH, T4TOTAL, FREET4, T3FREE, THYROIDAB in the last 72 hours. Anemia Panel: No results for input(s): VITAMINB12, FOLATE, FERRITIN, TIBC, IRON, RETICCTPCT in the last 72 hours. Sepsis Labs: No results for input(s): PROCALCITON, LATICACIDVEN in the last 168 hours.  Recent Results (from the past 240 hour(s))  MRSA PCR Screening     Status: None   Collection Time: 12/10/16  1:03 PM  Result Value Ref Range Status   MRSA by PCR NEGATIVE NEGATIVE Final    Comment:        The GeneXpert MRSA Assay (FDA approved for NASAL specimens only), is one component of a comprehensive MRSA colonization surveillance program. It is not intended to diagnose MRSA infection nor to guide or monitor treatment for MRSA infections.     Radiology Studies: Ct Tibia Fibula Right W Contrast  Result Date: 12/11/2016 CLINICAL DATA:  Right lower extremity pain and swelling for 10 days. EXAM: CT OF THE LOWER RIGHT EXTREMITY WITH CONTRAST TECHNIQUE: Multidetector CT imaging of the lower right extremity was performed according to the standard protocol following intravenous contrast administration. COMPARISON:  Radiographs 12/10/2016 CONTRAST:  151mL ISOVUE-300 IOPAMIDOL (ISOVUE-300) INJECTION 61% FINDINGS: Diffuse subcutaneous soft tissue swelling/ edema/fluid along with skin thickening consistent with cellulitis. There is also a long fluid collection along the anterior aspect of the mid tibia worrisome for an abscess. This measures 14 x 2.5 x 5.0 cm. Suspect underlying myofasciitis but no definite findings for pyomyositis. The knee and ankle joints are grossly normal. No findings suspicious for septic arthritis. No destructive bony changes to suggest osteomyelitis. The major  vascular structures appear patent. IMPRESSION: 1. Diffuse cellulitis and myofasciitis. 2. Large fluid collection along the anteromedial aspect of the tibia worrisome for abscess. 3. No findings to suggest septic arthritis or osteomyelitis or pyomyositis. Electronically Signed   By: Marijo Sanes M.D.   On: 12/11/2016 13:48   Scheduled Meds: . enoxaparin (LOVENOX) injection  80 mg Subcutaneous Q24H  . piperacillin-tazobactam (ZOSYN)  IV  3.375 g Intravenous Q8H  . potassium chloride  20 mEq Oral BID  . vancomycin  1,500 mg Intravenous Q12H   Continuous Infusions:   LOS: 2 days   Kerney Elbe, DO [facility] Pager [phone]  If 7PM-7AM, please contact night-coverage [email or web address] Password [email or web address] 12/13/2016, 12:07 PM

## 2016-12-14 ENCOUNTER — Inpatient Hospital Stay (HOSPITAL_COMMUNITY): Payer: Self-pay | Admitting: Certified Registered Nurse Anesthetist

## 2016-12-14 ENCOUNTER — Encounter (HOSPITAL_COMMUNITY)
Admission: EM | Disposition: A | Discharge status: Home Health | Payer: Self-pay | Source: Home / Self Care | Attending: Internal Medicine

## 2016-12-14 ENCOUNTER — Encounter (HOSPITAL_COMMUNITY): Payer: Self-pay | Admitting: Certified Registered Nurse Anesthetist

## 2016-12-14 DIAGNOSIS — L03115 Cellulitis of right lower limb: Secondary | ICD-10-CM

## 2016-12-14 DIAGNOSIS — L02415 Cutaneous abscess of right lower limb: Secondary | ICD-10-CM | POA: Diagnosis not present

## 2016-12-14 DIAGNOSIS — G4733 Obstructive sleep apnea (adult) (pediatric): Secondary | ICD-10-CM

## 2016-12-14 DIAGNOSIS — R739 Hyperglycemia, unspecified: Secondary | ICD-10-CM

## 2016-12-14 HISTORY — PX: I&D EXTREMITY: SHX5045

## 2016-12-14 SURGERY — IRRIGATION AND DEBRIDEMENT EXTREMITY
Anesthesia: General | Laterality: Right

## 2016-12-14 MED ORDER — HYDROCODONE-ACETAMINOPHEN 5-325 MG PO TABS
ORAL_TABLET | ORAL | Status: AC
  Filled 2016-12-14: qty 1

## 2016-12-14 MED ORDER — DEXAMETHASONE SODIUM PHOSPHATE 10 MG/ML IJ SOLN
INTRAMUSCULAR | Status: DC | PRN
  Administered 2016-12-14: 5 mg via INTRAVENOUS

## 2016-12-14 MED ORDER — DEXMEDETOMIDINE HCL IN NACL 200 MCG/50ML IV SOLN
INTRAVENOUS | Status: AC
  Filled 2016-12-14: qty 50

## 2016-12-14 MED ORDER — SUGAMMADEX SODIUM 200 MG/2ML IV SOLN
INTRAVENOUS | Status: AC
  Filled 2016-12-14: qty 2

## 2016-12-14 MED ORDER — FENTANYL CITRATE (PF) 100 MCG/2ML IJ SOLN
INTRAMUSCULAR | Status: AC
  Filled 2016-12-14: qty 4

## 2016-12-14 MED ORDER — HYDROMORPHONE HCL 1 MG/ML IJ SOLN
0.5000 mg | INTRAMUSCULAR | Status: AC | PRN
  Administered 2016-12-14 (×4): 0.5 mg via INTRAVENOUS

## 2016-12-14 MED ORDER — TRAMADOL HCL 50 MG PO TABS
100.0000 mg | ORAL_TABLET | Freq: Four times a day (QID) | ORAL | Status: DC | PRN
  Administered 2016-12-14 – 2016-12-15 (×3): 100 mg via ORAL
  Filled 2016-12-14 (×3): qty 2

## 2016-12-14 MED ORDER — PROPOFOL 10 MG/ML IV BOLUS
INTRAVENOUS | Status: DC | PRN
  Administered 2016-12-14: 200 mg via INTRAVENOUS

## 2016-12-14 MED ORDER — GLYCOPYRROLATE 0.2 MG/ML IJ SOLN
INTRAMUSCULAR | Status: DC | PRN
  Administered 2016-12-14 (×2): 0.1 mg via INTRAVENOUS

## 2016-12-14 MED ORDER — DEXAMETHASONE SODIUM PHOSPHATE 10 MG/ML IJ SOLN
INTRAMUSCULAR | Status: AC
  Filled 2016-12-14: qty 1

## 2016-12-14 MED ORDER — SUCCINYLCHOLINE CHLORIDE 200 MG/10ML IV SOSY
PREFILLED_SYRINGE | INTRAVENOUS | Status: DC | PRN
  Administered 2016-12-14: 120 mg via INTRAVENOUS

## 2016-12-14 MED ORDER — VANCOMYCIN HCL 1000 MG IV SOLR
INTRAVENOUS | Status: AC
  Filled 2016-12-14: qty 1000

## 2016-12-14 MED ORDER — LACTATED RINGERS IV SOLN
INTRAVENOUS | Status: DC | PRN
  Administered 2016-12-14 (×2): via INTRAVENOUS

## 2016-12-14 MED ORDER — MIDAZOLAM HCL 2 MG/2ML IJ SOLN
INTRAMUSCULAR | Status: AC
  Filled 2016-12-14: qty 2

## 2016-12-14 MED ORDER — PANTOPRAZOLE SODIUM 40 MG PO TBEC
40.0000 mg | DELAYED_RELEASE_TABLET | Freq: Every day | ORAL | Status: DC
  Administered 2016-12-14 – 2016-12-16 (×3): 40 mg via ORAL
  Filled 2016-12-14 (×4): qty 1

## 2016-12-14 MED ORDER — ONDANSETRON HCL 4 MG/2ML IJ SOLN
INTRAMUSCULAR | Status: DC | PRN
  Administered 2016-12-14: 4 mg via INTRAVENOUS

## 2016-12-14 MED ORDER — OXYCODONE-ACETAMINOPHEN 5-325 MG PO TABS
1.0000 | ORAL_TABLET | ORAL | Status: DC | PRN
  Administered 2016-12-14 – 2016-12-15 (×4): 1 via ORAL
  Filled 2016-12-14 (×4): qty 1

## 2016-12-14 MED ORDER — ONDANSETRON HCL 4 MG/2ML IJ SOLN
INTRAMUSCULAR | Status: AC
  Filled 2016-12-14: qty 2

## 2016-12-14 MED ORDER — LIDOCAINE 2% (20 MG/ML) 5 ML SYRINGE
INTRAMUSCULAR | Status: DC | PRN
  Administered 2016-12-14: 100 mg via INTRAVENOUS

## 2016-12-14 MED ORDER — LIDOCAINE 2% (20 MG/ML) 5 ML SYRINGE
INTRAMUSCULAR | Status: AC
  Filled 2016-12-14: qty 5

## 2016-12-14 MED ORDER — HYDROMORPHONE HCL 1 MG/ML IJ SOLN
INTRAMUSCULAR | Status: AC
  Filled 2016-12-14: qty 1

## 2016-12-14 MED ORDER — HYDROMORPHONE HCL 1 MG/ML IJ SOLN: 0.2500 mg | INTRAMUSCULAR | Status: DC | PRN

## 2016-12-14 MED ORDER — PROPOFOL 10 MG/ML IV BOLUS
INTRAVENOUS | Status: AC
  Filled 2016-12-14: qty 20

## 2016-12-14 MED ORDER — MIDAZOLAM HCL 2 MG/2ML IJ SOLN
INTRAMUSCULAR | Status: DC | PRN
  Administered 2016-12-14: 2 mg via INTRAVENOUS

## 2016-12-14 MED ORDER — KETOROLAC TROMETHAMINE 30 MG/ML IJ SOLN
30.0000 mg | Freq: Four times a day (QID) | INTRAMUSCULAR | Status: DC | PRN
  Administered 2016-12-14 – 2016-12-15 (×2): 30 mg via INTRAVENOUS
  Filled 2016-12-14 (×3): qty 1

## 2016-12-14 MED ORDER — PHENYLEPHRINE 40 MCG/ML (10ML) SYRINGE FOR IV PUSH (FOR BLOOD PRESSURE SUPPORT)
PREFILLED_SYRINGE | INTRAVENOUS | Status: AC
  Filled 2016-12-14: qty 10

## 2016-12-14 MED ORDER — PROMETHAZINE HCL 25 MG/ML IJ SOLN: 6.2500 mg | INTRAMUSCULAR | Status: DC | PRN

## 2016-12-14 MED ORDER — FENTANYL CITRATE (PF) 100 MCG/2ML IJ SOLN
INTRAMUSCULAR | Status: AC
  Filled 2016-12-14: qty 2

## 2016-12-14 MED ORDER — ROCURONIUM BROMIDE 50 MG/5ML IV SOSY
PREFILLED_SYRINGE | INTRAVENOUS | Status: AC
  Filled 2016-12-14: qty 5

## 2016-12-14 MED ORDER — SODIUM CHLORIDE 0.9 % IR SOLN
Status: DC | PRN
  Administered 2016-12-14: 9000 mL

## 2016-12-14 MED ORDER — LACTATED RINGERS IV SOLN
Freq: Once | INTRAVENOUS | Status: AC
  Administered 2016-12-14: 14:00:00 via INTRAVENOUS

## 2016-12-14 MED ORDER — FENTANYL CITRATE (PF) 100 MCG/2ML IJ SOLN
INTRAMUSCULAR | Status: DC | PRN
  Administered 2016-12-14 (×2): 50 ug via INTRAVENOUS
  Administered 2016-12-14: 100 ug via INTRAVENOUS

## 2016-12-14 SURGICAL SUPPLY — 49 items
BANDAGE ELASTIC 3 VELCRO ST LF (GAUZE/BANDAGES/DRESSINGS) IMPLANT
BNDG COHESIVE 1X5 TAN STRL LF (GAUZE/BANDAGES/DRESSINGS) IMPLANT
BNDG COHESIVE 4X5 TAN STRL (GAUZE/BANDAGES/DRESSINGS) IMPLANT
BNDG COHESIVE 6X5 TAN STRL LF (GAUZE/BANDAGES/DRESSINGS) ×3 IMPLANT
BNDG CONFORM 3 STRL LF (GAUZE/BANDAGES/DRESSINGS) IMPLANT
BNDG GAUZE STRTCH 6 (GAUZE/BANDAGES/DRESSINGS) ×3 IMPLANT
CORDS BIPOLAR (ELECTRODE) IMPLANT
COVER SURGICAL LIGHT HANDLE (MISCELLANEOUS) ×3 IMPLANT
CUFF TOURNIQUET SINGLE 24IN (TOURNIQUET CUFF) IMPLANT
CUFF TOURNIQUET SINGLE 34IN LL (TOURNIQUET CUFF) IMPLANT
CUFF TOURNIQUET SINGLE 44IN (TOURNIQUET CUFF) ×3 IMPLANT
DRAPE INCISE IOBAN 66X45 STRL (DRAPES) IMPLANT
DRAPE U-SHAPE 47X51 STRL (DRAPES) ×3 IMPLANT
DURAPREP 26ML APPLICATOR (WOUND CARE) ×3 IMPLANT
ELECT REM PT RETURN 9FT ADLT (ELECTROSURGICAL)
ELECTRODE REM PT RTRN 9FT ADLT (ELECTROSURGICAL) IMPLANT
GAUZE SPONGE 4X4 12PLY STRL (GAUZE/BANDAGES/DRESSINGS) ×3 IMPLANT
GAUZE XEROFORM 5X9 LF (GAUZE/BANDAGES/DRESSINGS) ×6 IMPLANT
GLOVE SKINSENSE NS SZ7.5 (GLOVE) ×4
GLOVE SKINSENSE STRL SZ7.5 (GLOVE) ×2 IMPLANT
GOWN STRL REIN XL XLG (GOWN DISPOSABLE) ×6 IMPLANT
HANDPIECE INTERPULSE COAX TIP (DISPOSABLE)
KIT BASIN OR (CUSTOM PROCEDURE TRAY) ×3 IMPLANT
KIT ROOM TURNOVER OR (KITS) ×3 IMPLANT
KIT STIMULAN RAPID CURE  10CC (Orthopedic Implant) ×2 IMPLANT
KIT STIMULAN RAPID CURE 10CC (Orthopedic Implant) ×1 IMPLANT
MANIFOLD NEPTUNE II (INSTRUMENTS) ×3 IMPLANT
PACK ORTHO EXTREMITY (CUSTOM PROCEDURE TRAY) ×3 IMPLANT
PAD ABD 8X10 STRL (GAUZE/BANDAGES/DRESSINGS) ×3 IMPLANT
PAD ARMBOARD 7.5X6 YLW CONV (MISCELLANEOUS) ×6 IMPLANT
PADDING CAST ABS 4INX4YD NS (CAST SUPPLIES)
PADDING CAST ABS COTTON 4X4 ST (CAST SUPPLIES) IMPLANT
PADDING CAST COTTON 6X4 STRL (CAST SUPPLIES) IMPLANT
SET HNDPC FAN SPRY TIP SCT (DISPOSABLE) IMPLANT
SPONGE LAP 18X18 X RAY DECT (DISPOSABLE) IMPLANT
STOCKINETTE IMPERVIOUS 9X36 MD (GAUZE/BANDAGES/DRESSINGS) IMPLANT
SUT ETHILON 2 0 FS 18 (SUTURE) IMPLANT
SUT ETHILON 2 0 PSLX (SUTURE) ×3 IMPLANT
SUT VIC AB 2-0 FS1 27 (SUTURE) ×3 IMPLANT
TOWEL OR 17X24 6PK STRL BLUE (TOWEL DISPOSABLE) IMPLANT
TOWEL OR 17X26 10 PK STRL BLUE (TOWEL DISPOSABLE) IMPLANT
TUBE ANAEROBIC SPECIMEN COL (MISCELLANEOUS) IMPLANT
TUBE CONNECTING 12'X1/4 (SUCTIONS) ×1
TUBE CONNECTING 12X1/4 (SUCTIONS) ×2 IMPLANT
TUBE FEEDING 5FR 15 INCH (TUBING) IMPLANT
TUBING CYSTO DISP (UROLOGICAL SUPPLIES) ×3 IMPLANT
UNDERPAD 30X30 (UNDERPADS AND DIAPERS) ×6 IMPLANT
WATER STERILE IRR 1000ML POUR (IV SOLUTION) IMPLANT
YANKAUER SUCT BULB TIP NO VENT (SUCTIONS) ×3 IMPLANT

## 2016-12-14 NOTE — Op Note (Addendum)
   Date of Surgery: 12/14/2016  INDICATIONS: Mr. Sobek is a 46 y.o.-year-old male with a right lower leg abscess;  The patient did consent to the procedure after discussion of the risks and benefits.  PREOPERATIVE DIAGNOSIS: Right lower leg deep abscess  POSTOPERATIVE DIAGNOSIS: Same.  PROCEDURE:  1. Irrigation and debridement of right lower leg deep abscess including skin, subcutaneous tissue, bone, periosteum 12 x 5 x 5 (300 sq cm) 2. Adjacent tissue rearrangement 10 cm right lower leg  SURGEON: N. Eduard Roux, M.D.  ASSIST: Ky Barban, RNFA.  ANESTHESIA:  general  IV FLUIDS AND URINE: See anesthesia.  ESTIMATED BLOOD LOSS: minimal mL.  IMPLANTS: stimulan beads with 1 g vancomycin  DRAINS: none  COMPLICATIONS: None.  DESCRIPTION OF PROCEDURE: The patient was brought to the operating room and placed supine on the operating table.  The patient had been signed prior to the procedure and this was documented. The patient had the anesthesia placed by the anesthesiologist.  A time-out was performed to confirm that this was the correct patient, site, side and location. The patient did receive antibiotics prior to the incision and was re-dosed during the procedure as needed at indicated intervals.  A tourniquet was placed.  The patient had the operative extremity prepped and draped in the standard surgical fashion.    I made a longitudinal incision directly over the fluctuant area of the right lower leg abscess. There was return of frank pus. This was immediately cultured. I then performed sharp excisional debridement of the skin, subcutaneous tissue, periosteum, bone that all appeared to be necrotic and infected. This was done with a knife, Roger, Dispensing optician. The abscess did go down to the anterior surface of the tibia. The tissue was debrided back to healthy bleeding tissue. This was then thoroughly irrigated with 9 L of normal saline. I then placed 10 mL of stimulant beads with 1 g of  vancomycin in the wound. I then perform adjacent tissue rearrangement 10 cm in order to close the wound. This was closed with 2-0 nylon suture.  POSTOPERATIVE PLAN: Patient will need to have an infectious disease consult for antibiotic treatment recommendations. He needs to keep his leg elevated all 2 help with the swelling and to allow the surgical wound to heal. I'll see him back in office in 2 weeks.  Azucena Cecil, MD [place] 5:07 PM

## 2016-12-14 NOTE — Transfer of Care (Signed)
Immediate Anesthesia Transfer of Care Note  Patient: Leslie Atkinson  Procedure(s) Performed: Procedure(s): IRRIGATION AND DEBRIDEMENT EXTREMITY (Right)  Patient Location: PACU  Anesthesia Type:General  Level of Consciousness: awake, alert  and oriented  Airway & Oxygen Therapy: Patient Spontanous Breathing and Patient connected to face mask oxygen  Post-op Assessment: Report given to RN, Post -op Vital signs reviewed and stable and Patient moving all extremities X 4  Post vital signs: Reviewed and stable  Last Vitals:  Vitals:   12/13/16 2252 12/14/16 0525  BP: 137/89 (!) 135/93  Pulse: 75 79  Resp: 19 18  Temp: 36.7 C 36.8 C    Last Pain:  Vitals:   12/14/16 1214  TempSrc:   PainSc: 6       Patients Stated Pain Goal: 3 (36/[id number]/94 [id number])  Complications: No apparent anesthesia complications

## 2016-12-14 NOTE — Progress Notes (Signed)
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 Patient was seen on 2/22 and diagnosed with cellulitis of the Right Leg. Patient was prescribed Keflex and sent home. Patient states that his leg continues to be sore and swollen and red up until 2 days ago. He finished antibiotics at that time and his pain had decreased, although he continued to have swelling and redness. He does report that the skin on the anterior shin created a blister only clear fluid. He denies fevers, chills, nausea, vomiting. Currently, the only pain that he has is in the skin pretibially where the skin has peeled off. Resting helps with the swelling, however this increases dramatically the patient is standing. There is no other red areas. He has been taking ibuprofen and Lortab to help with the pain with some relief. Was admitted for Right Leg Cellulitis. Abx were broadened to Vancomycin and IV Zosyn and a CT of the Leg was ordered to evaluate. CT of the Right leg showed diffuse cellulitis and myofacitis along with a large fluid collection (14 x 2.5 x5 cm) along the anteromedial aspect of the tibia worrisome for an abscess. Spoke with Orthopedic Surgery Dr. Erlinda Hong and recommended transfer to [facility] as there was no Orthopedic Coverage here. Discussed with patient and updated him about plan and bed was available late last night and early this AM and patient refused transfer because he did not want his sleep disturbed. Discussed with him about importance of being evaluated for suspected abscess and he understood and will request another bed as patient is now agreeable for transfer.     Assessment & Plan:   Principal Problem:   Cellulitis and abscess of right leg Active Problems:   Cellulitis   Elevated blood  pressure reading   Elevated blood sugar   Cellulitis of right anterior lower leg   Thrombocytosis (HCC)   Tobacco abuse   Abscess of leg, right   OSA (obstructive sleep apnea): Probable   Hyperglycemia  #1 cellulitis and abscess of right lower extremity Questionable etiology. Patient failed outpatient treatment with oral antibiotics. CT of the right lower extremity which was done which showed diffuse cellulitis or myofascia at this, large fluid collection along the anterior medial aspect of the tibia worrisome for abscess. Continue empiric IV vancomycin and IV Zosyn. Patient has been seen in consultation by orthopedics and plan is for patient to go to the operating room today for I and D. hopefully cultures will be obtained during I and D. once cultures are resulted will likely need to consult with ID for antibiotic duration and regimen. Supportive care.  #2 probable obstructive sleep apnea Patient will likely need outpatient follow-up with PCP a possible referral to pulmonary for sleep study.  #3 tobacco abuse Tobacco cessation.  #4 thrombocytosis Likely reactive in nature. Follow.  #5 hyperglycemia Hemoglobin A1c is 6. Follow.  #6 elevated blood pressure Stable. Follow.  #7 tobacco abuse Patient noted to use dip and found on it during the hospitalization. Patient was counseled on the dangers of using smokeless tobacco. Tobacco cessation.   DVT prophylaxis: Patient to the OR today. Postop prophylaxis per orthopedics. Code Status: Full Family Communication: Updated patient. No family at bedside. Disposition Plan: Home with home health pending I and D, with finalized sedation  of wound cultures hopefully to be obtained during I and D, improvement with cellulitis and per orthopedics.   Consultants:   Orthopedics: Dr.Xu 12/14/2016  Procedures:   Plain films of the right tib-fib 12/10/2016  CT right tib-fib 12/11/2016  Antimicrobials:   IV clindamycin 12/10/2016>>>>  12/11/2016  IV Zosyn 12/11/2016  IV vancomycin 12/10/2016   Subjective: Patient sleeping easily arousable. No chest pain. No shortness of breath. Patient states feels a little bit better than on admission. Patient states he feels he likely has sleep apnea however is self-employed. Patient does not have a PCP.  Objective: Vitals:   12/13/16 0446 12/13/16 1400 12/13/16 2252 12/14/16 0525  BP: (!) 142/88 128/74 137/89 (!) 135/93  Pulse: 77 74 75 79  Resp: 20 18 19 18   Temp: 97.5 F (36.4 C) 98.1 F (36.7 C) 98 F (36.7 C) 98.3 F (36.8 C)  TempSrc: Oral Oral Oral Oral  SpO2: 97% 98% 98% 97%  Weight:      Height:   5\' 7"  (1.702 m)     Intake/Output Summary (Last 24 hours) at 12/14/16 1059 Last data filed at 12/14/16 0955  Gross per 24 hour  Intake             1580 ml  Output                0 ml  Net             1580 ml   Filed Weights   12/10/16 1212 12/10/16 1754  Weight: (!) 147.4 kg (325 lb) (!) 164.1 kg (361 lb 11.2 oz)    Examination:  General exam: Appears calm and comfortable  Respiratory system: Clear to auscultation. Respiratory effort normal. Cardiovascular system: S1 & S2 heard, RRR. No JVD, murmurs, rubs, gallops or clicks. No pedal edema. Gastrointestinal system: Abdomen is Obese, mildly distended, soft and nontender. No organomegaly or masses felt. Normal bowel sounds heard. Central nervous system: Alert and oriented. No focal neurological deficits. Extremities: Right lower extremity with erythema on the anterior shin, some tenderness to palpation, warmth, small punctuate draining wounds noted. Skin: Right lower extremity with erythema on the anterior shin, warmth, tenderness to palpation, small punctuate draining wound. Psychiatry: Judgement and insight appear normal. Mood & affect appropriate.     Data Reviewed: I have personally reviewed following labs and imaging studies  CBC:  Recent Labs Lab 12/10/16 1220 12/11/16 0550 12/12/16 0510  12/13/16 0920  WBC 10.5 12.9* 9.9 10.4  NEUTROABS 5.8  --  5.8 6.3  HGB 12.8* 13.4 13.8 15.5  HCT 40.8 43.0 44.2 48.1  MCV 97.4 98.4 98.4 97.2  PLT 423* 456* 484* 967*   Basic Metabolic Panel:  Recent Labs Lab 12/10/16 1220 12/11/16 0550 12/12/16 0510 12/13/16 0920  NA 142 140 138 136  K 4.2 4.4 4.3 4.5  CL 104 97* 97* 94*  CO2 31 35* 32 32  GLUCOSE 130* 113* 111* 102*  BUN 13 12 13 18   CREATININE 0.74 0.82 0.77 0.81  CALCIUM 8.5* 8.6* 8.9 9.1  MG  --   --  2.0 2.0  PHOS  --   --  5.4* 5.0*   GFR: Estimated Creatinine Clearance: 171.5 mL/min (by C-G formula based on SCr of 0.81 mg/dL). Liver Function Tests:  Recent Labs Lab 12/10/16 1220 12/12/16 0510 12/13/16 0920  AST 17 16 35  ALT 20 16* 26  ALKPHOS 66 58 62  BILITOT 0.3 0.4 0.5  PROT 7.3 7.6 8.4*  ALBUMIN 2.3*  2.4* 2.8*   No results for input(s): LIPASE, AMYLASE in the last 168 hours. No results for input(s): AMMONIA in the last 168 hours. Coagulation Profile: No results for input(s): INR, PROTIME in the last 168 hours. Cardiac Enzymes: No results for input(s): CKTOTAL, CKMB, CKMBINDEX, TROPONINI in the last 168 hours. BNP (last 3 results) No results for input(s): PROBNP in the last 8760 hours. HbA1C:  Recent Labs  12/12/16 0510  HGBA1C 6.0*   CBG: No results for input(s): GLUCAP in the last 168 hours. Lipid Profile: No results for input(s): CHOL, HDL, LDLCALC, TRIG, CHOLHDL, LDLDIRECT in the last 72 hours. Thyroid Function Tests: No results for input(s): TSH, T4TOTAL, FREET4, T3FREE, THYROIDAB in the last 72 hours. Anemia Panel: No results for input(s): VITAMINB12, FOLATE, FERRITIN, TIBC, IRON, RETICCTPCT in the last 72 hours. Sepsis Labs: No results for input(s): PROCALCITON, LATICACIDVEN in the last 168 hours.  Recent Results (from the past 240 hour(s))  MRSA PCR Screening     Status: None   Collection Time: 12/10/16  1:03 PM  Result Value Ref Range Status   MRSA by PCR NEGATIVE  NEGATIVE Final    Comment:        The GeneXpert MRSA Assay (FDA approved for NASAL specimens only), is one component of a comprehensive MRSA colonization surveillance program. It is not intended to diagnose MRSA infection nor to guide or monitor treatment for MRSA infections.          Radiology Studies: No results found.      Scheduled Meds: . pantoprazole  40 mg Oral Q0600  . piperacillin-tazobactam (ZOSYN)  IV  3.375 g Intravenous Q8H  . potassium chloride  20 mEq Oral BID  . vancomycin  1,500 mg Intravenous Q12H   Continuous Infusions:   LOS: 3 days    Time spent: 18 mins    THOMPSON,DANIEL, MD [facility] Pager [phone]  If 7PM-7AM, please contact night-coverage [email or web address] Password [email or web address] 12/14/2016, 10:59 AM

## 2016-12-14 NOTE — Consult Note (Signed)
   ORTHOPAEDIC CONSULTATION  REQUESTING PHYSICIAN: Eugenie Filler, MD  Chief Complaint: RLE abscess, cellulitis  HPI: Leslie Atkinson is a 46 y.o. male who presents with RLE cellulitis for about a week that failed keflex.  He was admitted to [facility] and then transferred here due to lack of ortho coverage.  He's had worsening cellulitis and redness and pain on the anterior aspect of his shin.  He's currently on vanc and zosyn.  His last A1c is 6.0.  CT scan showed abscess of the RLE.  Ortho consulted.  Past Medical History:  Diagnosis Date  . Decreased vision    right eye  . Facial bones, closed fracture ([place]) 2008   left tripod fracture   History reviewed. No pertinent surgical history. Social History   Social History  . Marital status: Single    Spouse name: N/A  . Number of children: N/A  . Years of education: N/A   Social History Main Topics  . Smoking status: Never Smoker  . Smokeless tobacco: Current User    Types: Chew  . Alcohol use No  . Drug use: No  . Sexual activity: Not Asked   Other Topics Concern  . None   Social History Narrative  . None   No family history on file. - negative except otherwise stated in the family history section Allergies  Allergen Reactions  . [place] Venom [Bee Venom] Swelling    Facial swelling (severe)  . Sulfa Antibiotics Hives   Prior to Admission medications   Medication Sig Start Date End Date Taking? Authorizing Provider  ibuprofen (ADVIL,MOTRIN) 200 MG tablet Take 800 mg by mouth every 6 (six) hours as needed.   Yes Historical Provider, MD  cephALEXin (KEFLEX) 500 MG capsule Take 1 capsule (500 mg total) by mouth 4 (four) times daily. Patient not taking: Reported on 12/10/2016 12/01/16   Veryl Speak, MD  HYDROcodone-acetaminophen (NORCO/VICODIN) 5-325 MG tablet Take 1-2 tablets by mouth every 6 (six) hours as needed. Patient not taking: Reported on 12/10/2016 12/01/16   Veryl Speak, MD   No results found. -  pertinent xrays, CT, MRI studies were reviewed and independently interpreted  Positive ROS: All other systems have been reviewed and were otherwise negative with the exception of those mentioned in the HPI and as above.  Physical Exam: General: Alert, no acute distress Cardiovascular: No pedal edema Respiratory: No cyanosis, no use of accessory musculature GI: No organomegaly, abdomen is soft and non-tender Skin: No lesions in the area of chief complaint Neurologic: Sensation intact distally Psychiatric: Patient is competent for consent with normal mood and affect Lymphatic: No axillary or cervical lymphadenopathy  MUSCULOSKELETAL:  - severe cellulitis  - small punctate draining wound - compartments soft - NVI  Assessment: Right lower leg abscess  Plan: - to OR today for I&D - continue abx - will need ID consult postop - continue NPO - consent obtained  Thank you for the consult and the opportunity to see Leslie Atkinson  N. Eduard Roux, MD [place] 7:15 AM

## 2016-12-14 NOTE — Anesthesia Procedure Notes (Signed)
Procedure Name: Intubation Date/Time: 12/14/2016 4:26 PM Performed by: Rejeana Brock L Pre-anesthesia Checklist: Patient identified, Emergency Drugs available, Suction available and Patient being monitored Patient Re-evaluated:Patient Re-evaluated prior to inductionOxygen Delivery Method: Circle System Utilized Preoxygenation: Pre-oxygenation with 100% oxygen Intubation Type: IV induction Ventilation: Mask ventilation without difficulty and Oral airway inserted - appropriate to patient size Laryngoscope Size: Mac and 4 Grade View: Grade I Tube type: Oral Tube size: 7.5 mm Number of attempts: 1 Airway Equipment and Method: Stylet and Oral airway Placement Confirmation: ETT inserted through vocal cords under direct vision,  positive ETCO2 and breath sounds checked- equal and bilateral Secured at: 23 cm Tube secured with: Tape Dental Injury: Teeth and Oropharynx as per pre-operative assessment

## 2016-12-14 NOTE — Care Management Note (Signed)
Case Management Note  Patient Details  Name: Leslie Atkinson MRN: [id number] Date of Birth: [birth year]  Subjective/Objective:                    Action/Plan:  Spoke with patient at bedside. Confirmed face sheet information and patient is uninsured.   Provided patient with information on [facility] and [place] locations.   Patient states he works in [place] 90 percent of the time. Also provided information on [employer] and [employer] . Patient stated he will decide on which one to go too.   Also explain [place] letter ( medication assistance) can be provided on day of discharge.  Patient voiced understanding to all of above  Expected Discharge Date:                  Expected Discharge Plan:  Home/Self Care  In-House Referral:     Discharge planning Services  [place] [facility], Medication Assistance, [facility]  Post Acute Care Choice:    Choice offered to:  Patient  DME Arranged:    DME Agency:     HH Arranged:    [place] Agency:     Status of Service:  In process, will continue to follow  If discussed at Long Length of Stay Meetings, dates discussed:    Additional Comments:  Marilu Favre, RN 12/14/2016, 10:42 AM

## 2016-12-14 NOTE — Anesthesia Postprocedure Evaluation (Addendum)
Anesthesia Post Note  Patient: Eshaan Titzer  Procedure(s) Performed: Procedure(s) (LRB): IRRIGATION AND DEBRIDEMENT EXTREMITY (Right)  Patient location during evaluation: PACU Anesthesia Type: General Level of consciousness: sedated Pain management: pain level controlled Vital Signs Assessment: post-procedure vital signs reviewed and stable Respiratory status: spontaneous breathing and respiratory function stable Cardiovascular status: stable Anesthetic complications: no       Last Vitals:  Vitals:   12/14/16 1810 12/14/16 1856  BP: (!) 150/84 135/70  Pulse: 91 85  Resp: 12   Temp: 36.8 C 36.6 C    Last Pain:  Vitals:   12/14/16 1856  TempSrc: Axillary  PainSc:                  Tashawn Greff DANIEL

## 2016-12-14 NOTE — Anesthesia Preprocedure Evaluation (Signed)
Anesthesia Evaluation  Patient identified by MRN, date of birth, ID band Patient awake    Reviewed: Allergy & Precautions, NPO status , Patient's Chart, lab work & pertinent test results  History of Anesthesia Complications Negative for: history of anesthetic complications  Airway Mallampati: III  TM Distance: >3 FB Neck ROM: Full    Dental no notable dental hx. (+) Dental Advisory Given   Pulmonary sleep apnea ,    Pulmonary exam normal        Cardiovascular negative cardio ROS Normal cardiovascular exam     Neuro/Psych negative neurological ROS  negative psych ROS   GI/Hepatic negative GI ROS, Neg liver ROS,   Endo/Other  Morbid obesity  Renal/GU negative Renal ROS  negative genitourinary   Musculoskeletal negative musculoskeletal ROS (+)   Abdominal   Peds negative pediatric ROS (+)  Hematology negative hematology ROS (+)   Anesthesia Other Findings   Reproductive/Obstetrics negative OB ROS                             Anesthesia Physical Anesthesia Plan  ASA: III  Anesthesia Plan: General   Post-op Pain Management:    Induction: Intravenous, Rapid sequence and Cricoid pressure planned  Airway Management Planned: Oral ETT  Additional Equipment:   Intra-op Plan:   Post-operative Plan: Extubation in OR  Informed Consent: I have reviewed the patients History and Physical, chart, labs and discussed the procedure including the risks, benefits and alternatives for the proposed anesthesia with the patient or authorized representative who has indicated his/her understanding and acceptance.   Dental advisory given  Plan Discussed with: CRNA, Anesthesiologist and Surgeon  Anesthesia Plan Comments:         Anesthesia Quick Evaluation

## 2016-12-15 ENCOUNTER — Encounter (HOSPITAL_COMMUNITY): Payer: Self-pay | Admitting: Orthopaedic Surgery

## 2016-12-15 DIAGNOSIS — Z9889 Other specified postprocedural states: Secondary | ICD-10-CM

## 2016-12-15 DIAGNOSIS — B9689 Other specified bacterial agents as the cause of diseases classified elsewhere: Secondary | ICD-10-CM

## 2016-12-15 DIAGNOSIS — F17290 Nicotine dependence, other tobacco product, uncomplicated: Secondary | ICD-10-CM

## 2016-12-15 DIAGNOSIS — Z91030 Bee allergy status: Secondary | ICD-10-CM

## 2016-12-15 DIAGNOSIS — Z882 Allergy status to sulfonamides status: Secondary | ICD-10-CM

## 2016-12-15 DIAGNOSIS — K5900 Constipation, unspecified: Secondary | ICD-10-CM

## 2016-12-15 LAB — CBC WITH DIFFERENTIAL/PLATELET
BASOS PCT: 1 %
Basophils Absolute: 0.1 10*3/uL (ref 0.0–0.1)
EOS ABS: 0 10*3/uL (ref 0.0–0.7)
EOS PCT: 0 %
HCT: 43.9 % (ref 39.0–52.0)
HEMOGLOBIN: 13.7 g/dL (ref 13.0–17.0)
LYMPHS ABS: 2.8 10*3/uL (ref 0.7–4.0)
Lymphocytes Relative: 24 %
MCH: 30.6 pg (ref 26.0–34.0)
MCHC: 31.2 g/dL (ref 30.0–36.0)
MCV: 98 fL (ref 78.0–100.0)
MONOS PCT: 6 %
Monocytes Absolute: 0.7 10*3/uL (ref 0.1–1.0)
NEUTROS PCT: 69 %
Neutro Abs: 8.2 10*3/uL — ABNORMAL HIGH (ref 1.7–7.7)
PLATELETS: 440 10*3/uL — AB (ref 150–400)
RBC: 4.48 MIL/uL (ref 4.22–5.81)
RDW: 13.2 % (ref 11.5–15.5)
WBC: 11.7 10*3/uL — AB (ref 4.0–10.5)

## 2016-12-15 LAB — BASIC METABOLIC PANEL
Anion gap: 7 (ref 5–15)
BUN: 20 mg/dL (ref 6–20)
CALCIUM: 8.8 mg/dL — AB (ref 8.9–10.3)
CO2: 30 mmol/L (ref 22–32)
CREATININE: 0.88 mg/dL (ref 0.61–1.24)
Chloride: 99 mmol/L — ABNORMAL LOW (ref 101–111)
GFR calc non Af Amer: >60 mL/min (ref >60)
Glucose, Bld: 111 mg/dL — ABNORMAL HIGH (ref 65–99)
Potassium: 5.1 mmol/L (ref 3.5–5.1)
SODIUM: 136 mmol/L (ref 135–145)

## 2016-12-15 MED ORDER — ENOXAPARIN SODIUM 80 MG/0.8ML ~~LOC~~ SOLN
80.0000 mg | SUBCUTANEOUS | Status: DC
  Administered 2016-12-15: 80 mg via SUBCUTANEOUS
  Filled 2016-12-15: qty 0.8

## 2016-12-15 MED ORDER — OXYCODONE-ACETAMINOPHEN 5-325 MG PO TABS
1.0000 | ORAL_TABLET | ORAL | Status: DC | PRN
  Administered 2016-12-15 – 2016-12-16 (×2): 2 via ORAL
  Filled 2016-12-15 (×2): qty 2

## 2016-12-15 MED ORDER — LINEZOLID 600 MG PO TABS
600.0000 mg | ORAL_TABLET | Freq: Two times a day (BID) | ORAL | Status: DC
  Administered 2016-12-15 – 2016-12-16 (×3): 600 mg via ORAL
  Filled 2016-12-15 (×3): qty 1

## 2016-12-15 NOTE — Progress Notes (Signed)
   Subjective:  Patient reports pain as mild.    Objective:   VITALS:   Vitals:   12/14/16 1856 12/14/16 2157 12/15/16 0239 12/15/16 0651  BP: 135/70 (!) 146/85 (!) 153/84 138/76  Pulse: 85 87 84 81  Resp:  17 18 17   Temp: 97.8 F (36.6 C) 97.9 F (36.6 C) 97.3 F (36.3 C) 97.2 F (36.2 C)  TempSrc: Axillary Oral Oral Oral  SpO2: 96% 100% 97% 97%  Weight:      Height:        Neurologically intact Neurovascular intact Sensation intact distally Intact pulses distally Dorsiflexion/Plantar flexion intact Incision: dressing C/D/I and no drainage No cellulitis present Compartment soft   Lab Results  Component Value Date   WBC 10.4 12/13/2016   HGB 15.5 12/13/2016   HCT 48.1 12/13/2016   MCV 97.2 12/13/2016   PLT 476 (H) 12/13/2016     Assessment/Plan:  1 Day Post-Op   - clinically improved - will plan patient following up in my office in 1 week for wound check  Cashton Hosley 12/15/2016, 7:21 AM [phone]

## 2016-12-15 NOTE — Progress Notes (Signed)
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 Patient was seen on 2/22 and diagnosed with cellulitis of the Right Leg. Patient was prescribed Keflex and sent home. Patient states that his leg continues to be sore and swollen and red up until 2 days ago. He finished antibiotics at that time and his pain had decreased, although he continued to have swelling and redness. He does report that the skin on the anterior shin created a blister only clear fluid. He denies fevers, chills, nausea, vomiting. Currently, the only pain that he has is in the skin pretibially where the skin has peeled off. Resting helps with the swelling, however this increases dramatically the patient is standing. There is no other red areas. He has been taking ibuprofen and Lortab to help with the pain with some relief. Was admitted for Right Leg Cellulitis. Abx were broadened to Vancomycin and IV Zosyn and a CT of the Leg was ordered to evaluate. CT of the Right leg showed diffuse cellulitis and myofacitis along with a large fluid collection (14 x 2.5 x5 cm) along the anteromedial aspect of the tibia worrisome for an abscess. Spoke with Orthopedic Surgery Dr. Erlinda Hong and recommended transfer to [facility] as there was no Orthopedic Coverage here. Discussed with patient and updated him about plan and bed was available late last night and early this AM and patient refused transfer because he did not want his sleep disturbed. Discussed with him about importance of being evaluated for suspected abscess and he understood and will request another bed as patient is now agreeable for transfer.     Assessment & Plan:   Principal Problem:   Cellulitis and abscess of right leg Active Problems:   Cellulitis   Elevated blood  pressure reading   Elevated blood sugar   Cellulitis of right anterior lower leg   Thrombocytosis (HCC)   Tobacco abuse   Abscess of leg, right   OSA (obstructive sleep apnea): Probable   Hyperglycemia  #1 cellulitis and abscess of right lower extremity Questionable etiology. Patient failed outpatient treatment with oral antibiotics. CT of the right lower extremity which was done which showed diffuse cellulitis or myofascia at this, large fluid collection along the anterior medial aspect of the tibia worrisome for abscess. Continue empiric IV vancomycin and IV Zosyn. Patient has been seen in consultation by orthopedics and status post I and D of right lower leg deep abscess with placement of vancomycin beads. Cultures pending. Consult with ID for antibiotic duration and regimen. Supportive care.  #2 probable obstructive sleep apnea Patient will likely need outpatient follow-up with PCP a possible referral to pulmonary for sleep study.  #3 tobacco abuse Tobacco cessation.  #4 thrombocytosis Likely reactive in nature. Follow.  #5 hyperglycemia Hemoglobin A1c is 6. Counseling on diet and exercise has been expressed to patient. Consult with dietitian. Follow.  #6 elevated blood pressure Stable. Follow.  #7 tobacco abuse Patient noted to use dip and found on it during the hospitalization. Patient was counseled on the dangers of using smokeless tobacco. Tobacco cessation.   DVT prophylaxis: Lovenox. Code Status: Full Family Communication: Updated patient and family at bedside. Disposition Plan: Home with home health pending I and D, with finalized sedation of wound cultures hopefully to be obtained during I  and D, improvement with cellulitis and per orthopedics hopefully 01-2 days.   Consultants:   Orthopedics: Dr.Xu 12/14/2016  Procedures:   Plain films of the right tib-fib 12/10/2016  CT right tib-fib 12/11/2016  irrigation and debridement of right lower leg deep abscess  including skin, subcutaneous tissue, bone,periosteum, adjacent tissue rearrangement per Dr.Xu 12/14/2016  Antimicrobials:   IV clindamycin 12/10/2016>>  IV Zosyn 12/11/2016  IV vancomycin 12/10/2016>   Subjective: Patient sitting up in bed with family at bedside. No chest pain. No shortness of breath. Patient states feels a little bit better than on admission. Patient states he feels he likely has sleep apnea however is self-employed. Patient does not have a PCP.  Objective: Vitals:   12/14/16 2157 12/15/16 0239 12/15/16 0651 12/15/16 0934  BP: (!) 146/85 (!) 153/84 138/76 123/84  Pulse: 87 84 81 83  Resp: 17 18 17 16   Temp: 97.9 F (36.6 C) 97.3 F (36.3 C) 97.2 F (36.2 C) 97.4 F (36.3 C)  TempSrc: Oral Oral Oral Oral  SpO2: 100% 97% 97% 95%  Weight:      Height:        Intake/Output Summary (Last 24 hours) at 12/15/16 1222 Last data filed at 12/15/16 0934  Gross per 24 hour  Intake             1840 ml  Output             1720 ml  Net              120 ml   Filed Weights   12/10/16 1212 12/10/16 1754  Weight: (!) 147.4 kg (325 lb) (!) 164.1 kg (361 lb 11.2 oz)    Examination:  General exam: Appears calm and comfortable  Respiratory system: Clear to auscultation. Respiratory effort normal. Cardiovascular system: S1 & S2 heard, RRR. No JVD, murmurs, rubs, gallops or clicks. No pedal edema. Gastrointestinal system: Abdomen is Obese, mildly distended, soft and nontender. No organomegaly or masses felt. Normal bowel sounds heard. Central nervous system: Alert and oriented. No focal neurological deficits. Extremities: Right lower extremity wrapped in bandage.  Skin:  Psychiatry: Judgement and insight appear normal. Mood & affect appropriate.     Data Reviewed: I have personally reviewed following labs and imaging studies  CBC:  Recent Labs Lab 12/10/16 1220 12/11/16 0550 12/12/16 0510 12/13/16 0920 12/15/16 0908  WBC 10.5 12.9* 9.9 10.4 11.7*  NEUTROABS  5.8  --  5.8 6.3 8.2*  HGB 12.8* 13.4 13.8 15.5 13.7  HCT 40.8 43.0 44.2 48.1 43.9  MCV 97.4 98.4 98.4 97.2 98.0  PLT 423* 456* 484* 476* 983*   Basic Metabolic Panel:  Recent Labs Lab 12/10/16 1220 12/11/16 0550 12/12/16 0510 12/13/16 0920 12/15/16 0908  NA 142 140 138 136 136  K 4.2 4.4 4.3 4.5 5.1  CL 104 97* 97* 94* 99*  CO2 31 35* 32 32 30  GLUCOSE 130* 113* 111* 102* 111*  BUN 13 12 13 18 20   CREATININE 0.74 0.82 0.77 0.81 0.88  CALCIUM 8.5* 8.6* 8.9 9.1 8.8*  MG  --   --  2.0 2.0  --   PHOS  --   --  5.4* 5.0*  --    GFR: Estimated Creatinine Clearance: 157.9 mL/min (by C-G formula based on SCr of 0.88 mg/dL). Liver Function Tests:  Recent Labs Lab 12/10/16 1220 12/12/16 0510 12/13/16 0920  AST 17 16 35  ALT 20 16* 26  ALKPHOS 66 58 62  BILITOT 0.3  0.4 0.5  PROT 7.3 7.6 8.4*  ALBUMIN 2.3* 2.4* 2.8*   No results for input(s): LIPASE, AMYLASE in the last 168 hours. No results for input(s): AMMONIA in the last 168 hours. Coagulation Profile: No results for input(s): INR, PROTIME in the last 168 hours. Cardiac Enzymes: No results for input(s): CKTOTAL, CKMB, CKMBINDEX, TROPONINI in the last 168 hours. BNP (last 3 results) No results for input(s): PROBNP in the last 8760 hours. HbA1C: No results for input(s): HGBA1C in the last 72 hours. CBG: No results for input(s): GLUCAP in the last 168 hours. Lipid Profile: No results for input(s): CHOL, HDL, LDLCALC, TRIG, CHOLHDL, LDLDIRECT in the last 72 hours. Thyroid Function Tests: No results for input(s): TSH, T4TOTAL, FREET4, T3FREE, THYROIDAB in the last 72 hours. Anemia Panel: No results for input(s): VITAMINB12, FOLATE, FERRITIN, TIBC, IRON, RETICCTPCT in the last 72 hours. Sepsis Labs: No results for input(s): PROCALCITON, LATICACIDVEN in the last 168 hours.  Recent Results (from the past 240 hour(s))  MRSA PCR Screening     Status: None   Collection Time: 12/10/16  1:03 PM  Result Value Ref Range  Status   MRSA by PCR NEGATIVE NEGATIVE Final    Comment:        The GeneXpert MRSA Assay (FDA approved for NASAL specimens only), is one component of a comprehensive MRSA colonization surveillance program. It is not intended to diagnose MRSA infection nor to guide or monitor treatment for MRSA infections.   Aerobic/Anaerobic Culture (surgical/deep wound)     Status: None (Preliminary result)   Collection Time: 12/14/16  4:49 PM  Result Value Ref Range Status   Specimen Description ABSCESS  Final   Special Requests RIGHT LOWER LEG  Final   Gram Stain   Final    ABUNDANT WBC PRESENT, PREDOMINANTLY PMN NO ORGANISMS SEEN    Culture PENDING  Incomplete   Report Status PENDING  Incomplete         Radiology Studies: No results found.      Scheduled Meds: . pantoprazole  40 mg Oral Q0600  . piperacillin-tazobactam (ZOSYN)  IV  3.375 g Intravenous Q8H  . potassium chloride  20 mEq Oral BID  . vancomycin  1,500 mg Intravenous Q12H   Continuous Infusions:   LOS: 4 days    Time spent: 23 mins    Raesha Coonrod, MD [facility] Pager [phone]  If 7PM-7AM, please contact night-coverage [email or web address] Password [facility] 12/15/2016, 12:22 PM

## 2016-12-15 NOTE — Consult Note (Signed)
[place] for Infectious Disease       Reason for Consult: cellulitis with abscess   Referring Physician: Dr. Grandville Silos  Principal Problem:   Cellulitis and abscess of right leg Active Problems:   Cellulitis   Elevated blood pressure reading   Elevated blood sugar   Cellulitis of right anterior lower leg   Thrombocytosis (HCC)   Tobacco abuse   Abscess of leg, right   OSA (obstructive sleep apnea): Probable   Hyperglycemia   . enoxaparin (LOVENOX) injection  80 mg Subcutaneous Q24H  . pantoprazole  40 mg Oral Q0600  . piperacillin-tazobactam (ZOSYN)  IV  3.375 g Intravenous Q8H  . potassium chloride  20 mEq Oral BID  . vancomycin  1,500 mg Intravenous Q12H    Recommendations: linezolid 600 mg oral twice a day for 14 more days Follow up CBC 5-7 days after discharge  Assessment: He has cellulitis that developed into a deep abscess requiring surgical debridement.    HIV negative  Antibiotics: Vancomycin and zosyn  HPI: Leslie Atkinson is a 46 y.o. male with obesity intially was seen in the ED for right lower leg cellulitis and started on keflex but it persisted.  Went back to the ED with worsening infection and CT with extensive cellulitis, myofasciitis and large fluid collection without septic arthritis or osteomyelitis.  No definitive pyomyositis.  No fever or chills.  No associated diarrhea, n/v.  CT independently reviewed and confirmed large fluid collection.    Review of Systems:  Constitutional: negative for malaise and anorexia Gastrointestinal: positive for constipation Integument/breast: negative for rash All other systems reviewed and are negative    Past Medical History:  Diagnosis Date  . Decreased vision    right eye  . Facial bones, closed fracture ([place]) 2008   left tripod fracture    Social History  Substance Use Topics  . Smoking status: Never Smoker  . Smokeless tobacco: Current User    Types: Chew  . Alcohol use No    History  reviewed. No pertinent family history.  Allergies  Allergen Reactions  . Yellow [place] Venom [Bee Venom] Swelling    Facial swelling (severe)  . Sulfa Antibiotics Hives    Physical Exam: Constitutional: in no apparent distress and alert  Vitals:   12/15/16 0934 12/15/16 1340  BP: 123/84 (!) 150/84  Pulse: 83 81  Resp: 16 18  Temp: 97.4 F (36.3 C) 98.1 F (36.7 C)   EYES: anicteric ENMT: no thrush Cardiovascular: Cor RRR Respiratory: CTA B; normal respiratory effort GI: Bowel sounds are normal, soft, nt Musculoskeletal: no pedal edema noted, right leg wrapped Skin: negatives: no rash  Lab Results  Component Value Date   WBC 11.7 (H) 12/15/2016   HGB 13.7 12/15/2016   HCT 43.9 12/15/2016   MCV 98.0 12/15/2016   PLT 440 (H) 12/15/2016    Lab Results  Component Value Date   CREATININE 0.88 12/15/2016   BUN 20 12/15/2016   NA 136 12/15/2016   K 5.1 12/15/2016   CL 99 (L) 12/15/2016   CO2 30 12/15/2016    Lab Results  Component Value Date   ALT 26 12/13/2016   AST 35 12/13/2016   ALKPHOS 62 12/13/2016     Microbiology: Recent Results (from the past 240 hour(s))  MRSA PCR Screening     Status: None   Collection Time: 12/10/16  1:03 PM  Result Value Ref Range Status   MRSA by PCR NEGATIVE NEGATIVE Final  Comment:        The GeneXpert MRSA Assay (FDA approved for NASAL specimens only), is one component of a comprehensive MRSA colonization surveillance program. It is not intended to diagnose MRSA infection nor to guide or monitor treatment for MRSA infections.   Aerobic/Anaerobic Culture (surgical/deep wound)     Status: None (Preliminary result)   Collection Time: 12/14/16  4:49 PM  Result Value Ref Range Status   Specimen Description ABSCESS  Final   Special Requests RIGHT LOWER LEG  Final   Gram Stain   Final    ABUNDANT WBC PRESENT, PREDOMINANTLY PMN NO ORGANISMS SEEN    Culture NO GROWTH < 24 HOURS  Final   Report Status PENDING  Incomplete     Scharlene Gloss, [place] for Infectious Disease [facility] [email or web address] [phone] pager  [phone] cell 12/15/2016, 2:54 PM

## 2016-12-15 NOTE — Care Management Note (Signed)
Case Management Note  Patient Details  Name: Leslie Atkinson MRN: [id number] Date of Birth: March 30, 1971  Subjective/Objective:                    Action/Plan:  Followed up with patient regarding which clinic he plans to follow up with. Patient stated [facility] , however, he has not called yet. Patient's cell phone is at home and , room phone will not make a long distance call. Offered  my cell phone , patient stated his mother is coming to visit this afternoon and he will use hers. Explained if he changes his mind he can use mine or the nurse's.   Patient aware ID MD is consulted and will determine which antibiotic he will discharge on.   Patient aware if ABX is PO , [facility] letter will fill ABX prescription for $3.00. If IV ABX [facility] will assist and see if he qualifies for charity / assistance. Expected Discharge Date:                  Expected Discharge Plan:  Home/Self Care  In-House Referral:     Discharge planning Services  [place] [facility], Medication Assistance, [facility]  Post Acute Care Choice:    Choice offered to:  Patient  DME Arranged:    DME Agency:     HH Arranged:    [place] Agency:     Status of Service:  In process, will continue to follow  If discussed at Long Length of Stay Meetings, dates discussed:    Additional Comments:  Marilu Favre, RN 12/15/2016, 11:42 AM

## 2016-12-16 LAB — CBC
HCT: 44.7 % (ref 39.0–52.0)
HEMOGLOBIN: 14 g/dL (ref 13.0–17.0)
MCH: 30.4 pg (ref 26.0–34.0)
MCHC: 31.3 g/dL (ref 30.0–36.0)
MCV: 97.2 fL (ref 78.0–100.0)
Platelets: 418 10*3/uL — ABNORMAL HIGH (ref 150–400)
RBC: 4.6 MIL/uL (ref 4.22–5.81)
RDW: 13.4 % (ref 11.5–15.5)
WBC: 9.3 10*3/uL (ref 4.0–10.5)

## 2016-12-16 LAB — BASIC METABOLIC PANEL
ANION GAP: 9 (ref 5–15)
BUN: 21 mg/dL — ABNORMAL HIGH (ref 6–20)
CALCIUM: 9.1 mg/dL (ref 8.9–10.3)
CO2: 29 mmol/L (ref 22–32)
Chloride: 100 mmol/L — ABNORMAL LOW (ref 101–111)
Creatinine, Ser: 0.87 mg/dL (ref 0.61–1.24)
Glucose, Bld: 127 mg/dL — ABNORMAL HIGH (ref 65–99)
Potassium: 4.4 mmol/L (ref 3.5–5.1)
SODIUM: 138 mmol/L (ref 135–145)

## 2016-12-16 MED ORDER — ONDANSETRON HCL 4 MG PO TABS: 4.0000 mg | ORAL_TABLET | Freq: Four times a day (QID) | ORAL | 0 refills | Status: DC | PRN

## 2016-12-16 MED ORDER — PANTOPRAZOLE SODIUM 40 MG PO TBEC: 40.0000 mg | DELAYED_RELEASE_TABLET | Freq: Every day | ORAL | 0 refills | Status: DC

## 2016-12-16 MED ORDER — OXYCODONE-ACETAMINOPHEN 5-325 MG PO TABS: 1.0000 | ORAL_TABLET | ORAL | 0 refills | Status: DC | PRN

## 2016-12-16 MED ORDER — IBUPROFEN 200 MG PO TABS: 600.0000 mg | ORAL_TABLET | Freq: Four times a day (QID) | ORAL | 0 refills | Status: DC | PRN

## 2016-12-16 MED ORDER — LINEZOLID 600 MG PO TABS: 600.0000 mg | ORAL_TABLET | Freq: Two times a day (BID) | ORAL | 0 refills | Status: AC

## 2016-12-16 NOTE — Discharge Planning (Signed)
Pt IV discontinued and discharge info given by Marygrace Drought, RN.

## 2016-12-16 NOTE — Care Management Note (Addendum)
Case Management Note  Patient Details  Name: Leslie Atkinson MRN: [id number] Date of Birth: [birth year]  Subjective/Objective:                    Action/Plan:  Provided and explained [place] letter to patient . [facility] will not cover Zyvox.   Received consult from patient to call [name] for him. Spoke with Beverely Low at [facility] , patient qualifies for 30 days free of Zyvox  [employer] only. Beverely Low will fax approval letter with assistance card information : [place] [id number], [place], [employer] [id number]. Patient can take card to pharmacy to receive up to 30 days free of zyvox . He will also be provided an application to complete if he needs more Zyvox.   Received fax from [name]. Card and application given and explained to patient. Patient voiced understanding.  Patient discharging to his son's Rachel Bo )  Home: [address], [place] [address]  Patient states he will call [facility] today to schedule an appointment.   Patient voices understanding of all of the above.   Expected Discharge Date:                  Expected Discharge Plan:  Home Health   In-House Referral:     Discharge [place] [facility], Medication Assistance, [facility]  Post Acute Care Choice:    Choice offered to:  Patient  DME Arranged:    DME Agency:     HH Arranged:   RN [place] Agency:   [place]   Status of Service:  In process, will continue to follow  If discussed at Long Length of Stay Meetings, dates discussed:    Additional Comments:  Marilu Favre, RN 12/16/2016, 9:54 AM

## 2016-12-16 NOTE — Progress Notes (Signed)
Discharge instructions (including medications) discussed with and copy provided to patient/caregiver 

## 2016-12-16 NOTE — Discharge Summary (Signed)
Physician Discharge Summary  Aiken Withem VPX:[id number] DOB: Jun 24, 1971 DOA: 12/10/2016  PCP: No PCP Per Patient  Admit date: 12/10/2016 Discharge date: 12/16/2016  Time spent: 65 minutes  Recommendations for Outpatient Follow-up:  1. Patient be discharged home with home health RN. Patient will need dressings changed to dry dressing in 2 days and wrap over with Ace bandage. 2. [facility]. On follow-up patient in need a CBC done to follow-up on hemoglobin, neutrophils, platelet count as patient being discharged on Zyvox. Patient will benefit from likely referral to pulmonary for sleep study to rule out obstructive sleep apnea. 3. Follow-up with Dr. Erlinda Atkinson in 1 week for wound check.   Discharge Diagnoses:  Principal Problem:   Cellulitis and abscess of right leg Active Problems:   Cellulitis   Elevated blood pressure reading   Elevated blood sugar   Cellulitis of right anterior lower leg   Thrombocytosis (HCC)   Tobacco abuse   Abscess of leg, right   OSA (obstructive sleep apnea): Probable   Hyperglycemia   Discharge Condition: Stable and improved  Diet recommendation: Carb modified  Filed Weights   12/10/16 1212 12/10/16 1754  Weight: (!) 147.4 kg (325 lb) (!) 164.1 kg (361 lb 11.2 oz)    History of present illness:  Per Dr. Brien Atkinson Tomas is a 46 y.o. male with a history of obesity presented to the hospital for follow-up of cellulitis. Patient was seen on 2/22 and diagnosed with cellulitis. Patient was prescribed Keflex and sent home. Patient stated that his leg continued to be sore and swollen and red up until 2 days prior to admission. He finished antibiotics at that time and his pain had decreased, although he continued to have swelling and redness. He did report that the skin on the anterior shin created a blister only clear fluid. He denied fevers, chills, nausea, vomiting. On admission, the only pain that he had was in the skin  pretibially where the skin had peeled off. Resting helped with the swelling, however this increased dramatically when the patient is standing. There was no other red areas. He had been taking ibuprofen and Lortab to help with the pain.  Emergency Department Course: Patient given dose of vancomycin in the emergency department. White count 10. afebrile.   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 Patient was seen on 2/22 and diagnosed with cellulitis of the Right Leg. Patient was prescribed Keflex and sent home. Patient stated that his leg continued to be sore and swollen and red up until 2 days ago. He finished antibiotics at that time and his pain had decreased, although he continued to have swelling and redness. He did report that the skin on the anterior shin created a blister only clear fluid. He denied fevers, chills, nausea, vomiting. Currently, the only pain that he had was in the skin pretibially where the skin had peeled off. Resting helped with the swelling, however this increased dramatically when the patient is standing. There was no other red areas. He had been taking ibuprofen and Lortab to help with the pain with some relief. Was admitted for Right Leg Cellulitis. Abx were broadened to Vancomycin and IV Zosyn and a CT of the Leg was ordered to evaluate. CT of the Right leg showed diffuse cellulitis and myofacitis along with a large fluid collection (14 x 2.5 x5 cm) along the anteromedial aspect  of the tibia worrisome for an abscess. Spoke with Orthopedic Surgery Dr. Erlinda Atkinson and recommended transfer to [facility] as there was no Orthopedic Coverage here. Discussed with patient and updated him about plan and bed was available late last night and early this AM and patient refused transfer because he did not want his sleep disturbed. Discussed with him about importance of being  evaluated for suspected abscess and he understood and will request another bed as patient is now agreeable for transfer.    #1 cellulitis and abscess of right lower extremity Questionable etiology. Patient failed outpatient treatment with oral antibiotics. CT of the right lower extremity which was done which showed diffuse cellulitis or myofascitis, large fluid collection along the anterior medial aspect of the tibia worrisome for abscess. Patient was placed empirically on IV vancomycin and IV Zosyn. Patient has been seen in consultation by orthopedics and patient went to the operating room for I and D, and cultures were obtained during I and D. infectious diseases was consulted who recommended transitioning from IV antibiotics to oral linezolid 600 mg twice daily for 14 days. Patient improved clinically, remained afebrile, discharged home in stable and improved condition. Patient is to follow-up with Dr Leslie Atkinson in 1 week. Patient also need a CBC checked in 1 week. Outpatient follow-up.  #2 probable obstructive sleep apnea Patient will likely need outpatient follow-up with PCP for possible referral to pulmonary for sleep study.  #3 tobacco abuse Tobacco cessation.  #4 thrombocytosis Likely reactive in nature. Outpatient follow-up.  #5 hyperglycemia Hemoglobin A1c was 6. Patient was counseled on diet and exercise on discharge to help prevent him from progressing to full onset diabetes.   #6 elevated blood pressure Blood pressure was initially elevated during the hospitalization likely secondary to pain and acute infection. Elevated blood pressure improved and had resolved by day of discharge.  #7 tobacco abuse Patient noted to use dip and found on it during the hospitalization. Patient was counseled on the dangers of using smokeless tobacco. Tobacco cessation.  Procedures:  Plain films of the right tib-fib 12/10/2016  CT right tib-fib 12/11/2016  irrigation and debridement of right  lower leg deep abscess including skin, subcutaneous tissue, bone,periosteum, adjacent tissue rearrangement per Dr.Xu 12/14/2016   Consultations:  Orthopedics: Dr.Xu 12/14/2016  ID: Dr Linus Salmons 12/15/2016  Discharge Exam: Vitals:   12/15/16 2225 12/16/16 0557  BP: (!) 119/57 127/81  Pulse: 76 77  Resp: 18 18  Temp: 98.1 F (36.7 C) 98.4 F (36.9 C)    General: NAD Cardiovascular: RRR Respiratory: CTAB  Discharge Instructions   Discharge Instructions    Diet Carb Modified    Complete by:  As directed    Increase activity slowly    Complete by:  As directed      Current Discharge Medication List    START taking these medications   Details  linezolid (ZYVOX) 600 MG tablet Take 1 tablet (600 mg total) by mouth every 12 (twelve) hours. Take for 2 weeks then stop. Qty: 28 tablet, Refills: 0    ondansetron (ZOFRAN) 4 MG tablet Take 1 tablet (4 mg total) by mouth every 6 (six) hours as needed for nausea. Qty: 20 tablet, Refills: 0    oxyCODONE-acetaminophen (PERCOCET/ROXICET) 5-325 MG tablet Take 1-2 tablets by mouth every 4 (four) hours as needed for moderate pain or severe pain. Qty: 20 tablet, Refills: 0    pantoprazole (PROTONIX) 40 MG tablet Take 1 tablet (40 mg total) by mouth daily at 6 (six) AM.  Qty: 30 tablet, Refills: 0      CONTINUE these medications which have CHANGED   Details  ibuprofen (ADVIL,MOTRIN) 200 MG tablet Take 3 tablets (600 mg total) by mouth every 6 (six) hours as needed. Qty: 30 tablet, Refills: 0      STOP taking these medications     cephALEXin (KEFLEX) 500 MG capsule      HYDROcodone-acetaminophen (NORCO/VICODIN) 5-325 MG tablet        Allergies  Allergen Reactions  . Yellow Jacket Venom [Bee Venom] Swelling    Facial swelling (severe)  . Sulfa Antibiotics Hives   Follow-up Information    [facility]. Schedule an appointment as soon as possible for a visit.   Contact information: [place] [address] [phone]       Eduard Roux, MD Follow up in 1 week(s).   Specialty:  Orthopedic Surgery Why:  For wound re-check Contact information: [place] [address] [phone]            The results of significant diagnostics from this hospitalization (including imaging, microbiology, ancillary and laboratory) are listed below for reference.    Significant Diagnostic Studies: Dg Tibia/fibula Right  Result Date: 12/10/2016 CLINICAL DATA:  Right lower leg redness and swelling. EXAM: RIGHT TIBIA AND FIBULA - 2 VIEW COMPARISON:  None. FINDINGS: There is no evidence of fracture or other focal bone lesions. Soft tissues are unremarkable. IMPRESSION: Normal right tibia and fibula. Electronically Signed   By: Marijo Conception, M.D.   On: 12/10/2016 15:58   Ct Tibia Fibula Right W Contrast  Result Date: 12/11/2016 CLINICAL DATA:  Right lower extremity pain and swelling for 10 days. EXAM: CT OF THE LOWER RIGHT EXTREMITY WITH CONTRAST TECHNIQUE: Multidetector CT imaging of the lower right extremity was performed according to the standard protocol following intravenous contrast administration. COMPARISON:  Radiographs 12/10/2016 CONTRAST:  151mL ISOVUE-300 IOPAMIDOL (ISOVUE-300) INJECTION 61% FINDINGS: Diffuse subcutaneous soft tissue swelling/ edema/fluid along with skin thickening consistent with cellulitis. There is also a long fluid collection along the anterior aspect of the mid tibia worrisome for an abscess. This measures 14 x 2.5 x 5.0 cm. Suspect underlying myofasciitis but no definite findings for pyomyositis. The knee and ankle joints are grossly normal. No findings suspicious for septic arthritis. No destructive bony changes to suggest osteomyelitis. The major vascular structures appear patent. IMPRESSION: 1. Diffuse cellulitis and myofasciitis. 2. Large fluid collection along the anteromedial aspect of the tibia worrisome  for abscess. 3. No findings to suggest septic arthritis or osteomyelitis or pyomyositis. Electronically Signed   By: Marijo Sanes M.D.   On: 12/11/2016 13:48    Microbiology: Recent Results (from the past 240 hour(s))  MRSA PCR Screening     Status: None   Collection Time: 12/10/16  1:03 PM  Result Value Ref Range Status   MRSA by PCR NEGATIVE NEGATIVE Final    Comment:        The GeneXpert MRSA Assay (FDA approved for NASAL specimens only), is one component of a comprehensive MRSA colonization surveillance program. It is not intended to diagnose MRSA infection nor to guide or monitor treatment for MRSA infections.   Aerobic/Anaerobic Culture (surgical/deep wound)     Status: None (Preliminary result)   Collection Time: 12/14/16  4:49 PM  Result Value Ref Range Status   Specimen Description ABSCESS  Final   Special Requests RIGHT LOWER LEG  Final   Gram  Stain   Final    ABUNDANT WBC PRESENT, PREDOMINANTLY PMN NO ORGANISMS SEEN    Culture   Final    NO GROWTH 2 DAYS NO ANAEROBES ISOLATED; CULTURE IN PROGRESS FOR 5 DAYS   Report Status PENDING  Incomplete     Labs: Basic Metabolic Panel:  Recent Labs Lab 12/11/16 0550 12/12/16 0510 12/13/16 0920 12/15/16 0908 12/16/16 1012  NA 140 138 136 136 138  K 4.4 4.3 4.5 5.1 4.4  CL 97* 97* 94* 99* 100*  CO2 35* 32 32 30 29  GLUCOSE 113* 111* 102* 111* 127*  BUN 12 13 18 20  21*  CREATININE 0.82 0.77 0.81 0.88 0.87  CALCIUM 8.6* 8.9 9.1 8.8* 9.1  MG  --  2.0 2.0  --   --   PHOS  --  5.4* 5.0*  --   --    Liver Function Tests:  Recent Labs Lab 12/10/16 1220 12/12/16 0510 12/13/16 0920  AST 17 16 35  ALT 20 16* 26  ALKPHOS 66 58 62  BILITOT 0.3 0.4 0.5  PROT 7.3 7.6 8.4*  ALBUMIN 2.3* 2.4* 2.8*   No results for input(s): LIPASE, AMYLASE in the last 168 hours. No results for input(s): AMMONIA in the last 168 hours. CBC:  Recent Labs Lab 12/10/16 1220 12/11/16 0550 12/12/16 0510 12/13/16 0920  12/15/16 0908 12/16/16 1012  WBC 10.5 12.9* 9.9 10.4 11.7* 9.3  NEUTROABS 5.8  --  5.8 6.3 8.2*  --   HGB 12.8* 13.4 13.8 15.5 13.7 14.0  HCT 40.8 43.0 44.2 48.1 43.9 44.7  MCV 97.4 98.4 98.4 97.2 98.0 97.2  PLT 423* 456* 484* 476* 440* 418*   Cardiac Enzymes: No results for input(s): CKTOTAL, CKMB, CKMBINDEX, TROPONINI in the last 168 hours. BNP: BNP (last 3 results) No results for input(s): BNP in the last 8760 hours.  ProBNP (last 3 results) No results for input(s): PROBNP in the last 8760 hours.  CBG: No results for input(s): GLUCAP in the last 168 hours.     SignedIrine Seal MD.  [facility] 12/16/2016, 11:58 AM

## 2016-12-16 NOTE — Evaluation (Signed)
Physical Therapy Evaluation Patient Details Name: Leslie Atkinson MRN: [id number] DOB: [birth year] Today's Date: 12/16/2016   History of Present Illness  Pt is a 46 yo male 12/01/16 dx of cellulitis on R shin 12/14/16 surgical debridement of abcess. PMH is unremarkable but has experienced elevated BP and blood glucose during hospital stay.  Clinical Impression  Patient evaluated by Physical Therapy with no further acute PT needs identified. All education has been completed and the patient has no further questions.  See below for any follow-up Physical Therapy or equipment needs. PT is signing off. Thank you for this referral.    Follow Up Recommendations No PT follow up    Equipment Recommendations  None recommended by PT    Recommendations for Other Services       Precautions / Restrictions Restrictions Weight Bearing Restrictions: Yes RLE Weight Bearing: Weight bearing as tolerated      Mobility  Bed Mobility Overal bed mobility: Independent                Transfers Overall transfer level: Independent                  Ambulation/Gait Ambulation/Gait assistance: Independent Ambulation Distance (Feet): 600 Feet Assistive device: None Gait Pattern/deviations: Step-through pattern;Decreased step length - right;Antalgic Gait velocity: slowed Gait velocity interpretation: Below normal speed for age/gender General Gait Details: pt was concerned about needing crutches with ambulation but was able to ambulate 600 feet without any LoB, or buckling and minimal decreased stance on R LE.  Stairs Stairs: Yes Stairs assistance: Modified independent (Device/Increase time) Stair Management: One rail Right Number of Stairs: 14 General stair comments: Pt was educated on using L foot to go up and R foot 1st to go down  Wheelchair Mobility    Modified Rankin (Stroke Patients Only)       Balance Overall balance assessment: Independent                                            Pertinent Vitals/Pain Pain Assessment: 0-10 Pain Score: 6  Pain Location:  R LE Pain Descriptors / Indicators: Sore;Aching;Burning  VSS    Home Living Family/patient expects to be discharged to:: Private residence Living Arrangements: Children Available Help at Discharge: Family;Available 24 hours/day Type of Home: Mobile home Home Access: Stairs to enter Entrance Stairs-Rails: Can reach both Entrance Stairs-Number of Steps: 4 Home Layout: One level Home Equipment: Cane - single point;Walker - 2 wheels      Prior Function Level of Independence: Independent               Hand Dominance        Extremity/Trunk Assessment   Upper Extremity Assessment Upper Extremity Assessment: Overall WFL for tasks assessed    Lower Extremity Assessment Lower Extremity Assessment: Overall WFL for tasks assessed (evaluated ROM, MMT and sensation)    Cervical / Trunk Assessment Cervical / Trunk Assessment: Normal  Communication   Communication: No difficulties  Cognition Arousal/Alertness: Awake/alert Behavior During Therapy: WFL for tasks assessed/performed Overall Cognitive Status: Within Functional Limits for tasks assessed                      General Comments      Exercises     Assessment/Plan    PT Assessment Patent does not need any further PT services  PT Problem List  PT Treatment Interventions      PT Goals (Current goals can be found in the Care Plan section)  Acute Rehab PT Goals Patient Stated Goal: go home    Frequency     Barriers to discharge        Co-evaluation               End of Session Equipment Utilized During Treatment: Gait belt Activity Tolerance: Patient tolerated treatment well Patient left: in bed;with call bell/phone within reach Nurse Communication: Patient requests pain meds;Mobility status PT Visit Diagnosis: Other abnormalities of gait and mobility (R26.89)          Time: 1751-0258 PT Time Calculation (min) (ACUTE ONLY): 23 min   Charges:   PT Evaluation $PT Eval Low Complexity: 1 Procedure PT Treatments $Gait Training: 8-22 mins   PT G Codes:         Bailey Mech Fleet 12/16/2016, 11:12 AM  Dani Gobble. Migdalia Dk PT, DPT Acute Rehabilitation  [phone] Pager [phone]

## 2016-12-19 LAB — AEROBIC/ANAEROBIC CULTURE W GRAM STAIN (SURGICAL/DEEP WOUND)

## 2016-12-19 LAB — AEROBIC/ANAEROBIC CULTURE (SURGICAL/DEEP WOUND): CULTURE: NO GROWTH

## 2016-12-21 ENCOUNTER — Telehealth (INDEPENDENT_AMBULATORY_CARE_PROVIDER_SITE_OTHER): Payer: Self-pay | Admitting: Orthopaedic Surgery

## 2016-12-21 NOTE — Telephone Encounter (Signed)
Patient called asking a few questions about his incision from surgery. CB #  [phone]

## 2016-12-22 NOTE — Telephone Encounter (Signed)
Called pt no answer. Could not leave VM. Need to know what questions he has?! If he calls please ask what questions he has. Thanks.   Will try again later.

## 2016-12-23 NOTE — Telephone Encounter (Signed)
Tried to call patient again no answer.  I could leave a voicemail today. LMOM to return the call to see what questions he has. Will wait for call back.

## 2016-12-26 ENCOUNTER — Ambulatory Visit (INDEPENDENT_AMBULATORY_CARE_PROVIDER_SITE_OTHER): Payer: 59 | Admitting: Orthopaedic Surgery

## 2016-12-27 ENCOUNTER — Encounter (INDEPENDENT_AMBULATORY_CARE_PROVIDER_SITE_OTHER): Payer: Self-pay | Admitting: Orthopaedic Surgery

## 2016-12-27 ENCOUNTER — Ambulatory Visit (INDEPENDENT_AMBULATORY_CARE_PROVIDER_SITE_OTHER): Payer: Self-pay | Admitting: Orthopaedic Surgery

## 2016-12-27 DIAGNOSIS — L02415 Cutaneous abscess of right lower limb: Secondary | ICD-10-CM

## 2016-12-27 DIAGNOSIS — L03115 Cellulitis of right lower limb: Secondary | ICD-10-CM

## 2016-12-27 NOTE — Progress Notes (Signed)
Patient is 2 weeks status post washout of a deep right lower leg abscess. He follows up today for a wound check. He has a small area of superficial dehiscence with exposed antibiotic beads. There is serous drainage. There is no evidence of cellulitis or continued infection. We removed the sutures today. We will begin wet-to-dry dressings twice a day. Continue antibiotics per infectious disease. I'll like to see him back in 2 weeks for another wound check.

## 2016-12-29 ENCOUNTER — Telehealth (INDEPENDENT_AMBULATORY_CARE_PROVIDER_SITE_OTHER): Payer: Self-pay | Admitting: Orthopaedic Surgery

## 2016-12-29 NOTE — Telephone Encounter (Signed)
Please advise. Thank you

## 2016-12-29 NOTE — Telephone Encounter (Signed)
Wet to dry dressings BID

## 2016-12-29 NOTE — Telephone Encounter (Signed)
[facility] nurse called to advise that she did not see pt this week, he was unavailable.  She also wants to know what the new wound care orders are.   [phone]

## 2016-12-30 NOTE — Telephone Encounter (Signed)
LMOM with details. See message below. Thanks.

## 2017-01-16 ENCOUNTER — Ambulatory Visit (INDEPENDENT_AMBULATORY_CARE_PROVIDER_SITE_OTHER): Payer: Self-pay | Admitting: Orthopaedic Surgery

## 2017-01-16 DIAGNOSIS — L03115 Cellulitis of right lower limb: Secondary | ICD-10-CM

## 2017-01-16 DIAGNOSIS — L02415 Cutaneous abscess of right lower limb: Secondary | ICD-10-CM

## 2017-01-16 NOTE — Progress Notes (Signed)
Patient comes back today for wound check. The wound has decreased in size. He continues to have some serous drainage. Overall there is no signs of worsening. Continue wet-to-dry dressings, see him back in 2 weeks of has not healed up or significantly improved I may take him back for secondary closure of the wound.

## 2017-01-30 ENCOUNTER — Ambulatory Visit (INDEPENDENT_AMBULATORY_CARE_PROVIDER_SITE_OTHER): Payer: 59 | Admitting: Orthopaedic Surgery

## 2017-03-11 NOTE — Addendum Note (Signed)
Addendum  created 03/11/17 1043 by Duane Boston, MD   Sign clinical note

## 2017-05-15 DIAGNOSIS — Z139 Encounter for screening, unspecified: Secondary | ICD-10-CM

## 2017-05-15 LAB — GLUCOSE, POCT (MANUAL RESULT ENTRY): POC GLUCOSE: 129 mg/dL — AB (ref 70–99)

## 2017-05-15 NOTE — Congregational Nurse Program (Signed)
Congregational Nurse Program Note  Date of Encounter: 05/15/2017  Past Medical History: Past Medical History:  Diagnosis Date  . Decreased vision    right eye  . Facial bones, closed fracture [facility]) 2008   left tripod fracture    Encounter Details:     CNP Questionnaire - 05/15/17 1453      Patient Demographics   Is this a new or existing patient? New   Patient is considered a/an Not Applicable   Race Caucasian/White     Patient Assistance   Location of Patient [place]   Patient's financial/insurance status Low Income;Self-Pay (Uninsured)   Uninsured Patient ([place]) Yes   Interventions Counseled to make appt. with provider;Assisted patient in making appt.   Patient referred to apply for the following financial assistance Not Applicable   Food insecurities addressed Not Applicable   Transportation assistance No   Assistance securing medications No   Educational health offerings Chronic disease;Health literacy;Navigating the healthcare system;Hypertension     Encounter Details   Primary purpose of visit Chronic Illness/Condition Visit;Education/Health Concerns;Acute Illness/Condition Visit;Navigating the Healthcare System   Was an Emergency Department visit averted? Not Applicable   Does patient have a medical provider? No   Patient referred to [place] [facility]   Was a mental health screening completed? (GAINS tool) No   Does patient have dental issues? Yes   Was a dental referral made? Yes  to primary care for antibiotics possibly   Does patient have vision issues? Yes   Was a vision referral made? No   Does your patient have an abnormal blood pressure today? Yes   Since previous encounter, have you referred patient for abnormal blood pressure that resulted in a new diagnosis or medication change? No   Does your patient have an abnormal blood glucose today? No   Since previous encounter, have you referred patient for abnormal  blood glucose that resulted in a new diagnosis or medication change? No   Was there a life-saving intervention made? No      New client to [employer]. Lives with his significant other for many years. Currently he is not working and has not worked for several months since being hospitalized with cellulitis of his right leg, which he was last seen April 2018 and client stopped going to his follow up appointments with Dr. Erlinda Hong in [place]. States, "I didn't have the money to go back and forth and I was afraid he'd operate on me again". Client recently was awarded food stamps. 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   Today he is here seeking assistance in navigating into a Primary care provider. He also states that now he has a lower right sided tooth that he thinks is abscessed and has been painful since he talked to me last Thursday. He states that it had been swollen on the right side and up to his ear. He states he "popped a pus pocket over the weekend" and the pain is less and swelling. States he has been taking BC powders as needed for pain.  Client is alert and oriented to person, place and time and very talkative. Does not appear to be anxious or nervous. Denies any pain at present. No swelling noted of his face, however upon inspection of his tooth and gum lower right , it appears the gum is swollen and red with an area of possible infection. Discussed several times with client about good oral hygiene especially now. Discussed using  warm "salt" rinses after each meal and to brush after each meal and before bedtime. Discussed with client about not attempting to manually force infection out of area any further and about not doing that with other areas of possible infection. We discussed seeking medical treatment for any areas of infection. Client states that he has "popped places of infection before and ended up getting worse" we discussed potential for spreading infection and adding other  bacteria from his hands to the areas. Client states understanding. Client also states that the vision in his right eye has recently become worse that before he could see a little , but now basically nothing much. He denies pain in the eye and states this happened two to three weeks ago. He also has complaints of lower back pain that radiates to his left leg on occasion when getting quickly out of bed. No back pain per client at present. Vitals signs today: Temp 98.0 orally (client states he thinks he has fever over the weekend). Blood pressure 140/92, pulse 89. POCT glucose non fasting 129 (he had eaten [employer] burger 20 min prior).    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 Discussed dental services and that upon becoming a [facility] medical client, they will add him to list for dental services. Client states understanding. Referral made to the [facility] and requested an appointment as soon as possible for possible dental abscess. Appointment secured for next morning of 05/16/17 at 0915 am. Contact information given to client.   Plan: [facility] for primary medical care and medical management and possible dental referral.  Discussed good oral hygiene at length with client and he states understanding. Rinse mouth with warm salt rinse after each meal and as needed. Maintain good oral care with brushing his teeth regularly.  Discussed healthy diet to avoid sugary soft drinks , increase water intake.   Discussed with client that if  before tomorrow he has increased pain, swelling of face or fever to seek medical attention at the Emergency room. Client states understanding.   Discussed at length the importance of keeping medical appointments and the importance of ongoing medical care and management, to address health issues early and for preventative screenings. Client states understanding.   Discussed also the role of increased weight on back issues as well as blood pressure. Client states he has lost "some weight". And that increased weight can play a role in increasing chances of diabetes. Client states understanding. Again discussed decreasing juices and sugary sodas and increasing water.  Will follow as needed.

## 2017-05-16 ENCOUNTER — Ambulatory Visit: Payer: Self-pay | Admitting: Physician Assistant

## 2017-05-16 ENCOUNTER — Encounter: Payer: Self-pay | Admitting: Physician Assistant

## 2017-05-16 ENCOUNTER — Other Ambulatory Visit: Payer: Self-pay | Admitting: Physician Assistant

## 2017-05-16 VITALS — BP 154/102 | HR 83 | Temp 98.1°F | Ht 67.0 in | Wt 336.5 lb

## 2017-05-16 DIAGNOSIS — Z1211 Encounter for screening for malignant neoplasm of colon: Secondary | ICD-10-CM

## 2017-05-16 DIAGNOSIS — I10 Essential (primary) hypertension: Secondary | ICD-10-CM

## 2017-05-16 DIAGNOSIS — K429 Umbilical hernia without obstruction or gangrene: Secondary | ICD-10-CM

## 2017-05-16 DIAGNOSIS — Z720 Tobacco use: Secondary | ICD-10-CM

## 2017-05-16 DIAGNOSIS — R5383 Other fatigue: Secondary | ICD-10-CM

## 2017-05-16 DIAGNOSIS — H547 Unspecified visual loss: Secondary | ICD-10-CM

## 2017-05-16 DIAGNOSIS — R7303 Prediabetes: Secondary | ICD-10-CM

## 2017-05-16 DIAGNOSIS — K047 Periapical abscess without sinus: Secondary | ICD-10-CM

## 2017-05-16 DIAGNOSIS — Z13220 Encounter for screening for lipoid disorders: Secondary | ICD-10-CM

## 2017-05-16 MED ORDER — AMOXICILLIN 500 MG PO CAPS: 500.0000 mg | ORAL_CAPSULE | Freq: Three times a day (TID) | ORAL | 0 refills | Status: DC

## 2017-05-16 MED ORDER — LISINOPRIL 10 MG PO TABS: 10.0000 mg | ORAL_TABLET | Freq: Every day | ORAL | 1 refills | Status: DC

## 2017-05-16 NOTE — Progress Notes (Signed)
BP (!) 154/102 (BP Location: Left Arm, Patient Position: Sitting, Cuff Size: Large)   Pulse 83   Temp 98.1 F (36.7 C) (Other (Comment))   Ht 5\' 7"  (1.702 m)   Wt (!) 336 lb 8 oz (152.6 kg)   SpO2 94%   BMI 52.70 kg/m    Subjective:    Patient ID: Leslie Atkinson, male    DOB: 07-01-1971, 46 y.o.   MRN: [id number]  HPI: Leslie Atkinson is a 46 y.o. male presenting on 05/16/2017 for New Patient (Initial Visit) (abscess tooth)   HPI   Pt's wife is with him today.  Pt states tooth abscess "off and on".  He says this time it started up last Wednesday.  He complains of subjective fever.  He says his jaw was swelled up.  Pt is longtime user of chewing tobacco.   Pt says long history of elevated blood pressure.  He says he was on amlodipine 5mg  about 15 years ago but nothing since.   He is concerned about not being able to see out of his R eye.  He says he has been legally blind in that eye for years but says it has diminished.  He says he can see his own hand when he holds it out in front of him but says he can't see anything on the eye chart with the R eye.   Relevant past medical, surgical, family and social history reviewed and updated as indicated. Interim medical history since our last visit reviewed. Allergies and medications reviewed and updated.  CURRENT MEDS: Ibuprofen prn  Review of Systems  Constitutional: Positive for diaphoresis, fatigue and fever. Negative for appetite change, chills and unexpected weight change.  HENT: Positive for dental problem, drooling and ear pain. Negative for congestion, facial swelling, hearing loss, mouth sores, sneezing, sore throat, trouble swallowing and voice change.   Eyes: Positive for discharge. Negative for pain, redness, itching and visual disturbance.  Respiratory: Negative for cough, choking, shortness of breath and wheezing.   Cardiovascular: Negative for chest pain, palpitations and leg swelling.  Gastrointestinal: Negative for  abdominal pain, blood in stool, constipation, diarrhea and vomiting.  Endocrine: Negative for cold intolerance, heat intolerance and polydipsia.  Genitourinary: Negative for decreased urine volume, dysuria and hematuria.  Musculoskeletal: Positive for back pain and gait problem. Negative for arthralgias.  Skin: Negative for rash.  Allergic/Immunologic: Negative for environmental allergies.  Neurological: Negative for seizures, syncope, light-headedness and headaches.  Hematological: Negative for adenopathy.  Psychiatric/Behavioral: Negative for agitation, dysphoric mood and suicidal ideas. The patient is not nervous/anxious.     Per HPI unless specifically indicated above     Objective:    BP (!) 154/102 (BP Location: Left Arm, Patient Position: Sitting, Cuff Size: Large)   Pulse 83   Temp 98.1 F (36.7 C) (Other (Comment))   Ht 5\' 7"  (1.702 m)   Wt (!) 336 lb 8 oz (152.6 kg)   SpO2 94%   BMI 52.70 kg/m   Wt Readings from Last 3 Encounters:  05/16/17 (!) 336 lb 8 oz (152.6 kg)  05/15/17 (!) 335 lb 12.8 oz (152.3 kg)  12/10/16 (!) 361 lb 11.2 oz (164.1 kg)    Physical Exam  Constitutional: He is oriented to person, place, and time. He appears well-developed and well-nourished.  HENT:  Head: Normocephalic and atraumatic.  Mouth/Throat: Uvula is midline and oropharynx is clear and moist. No trismus in the jaw. Abnormal dentition. Dental abscesses and dental caries present. No uvula swelling.  No oropharyngeal exudate.  Face without swelling.  Very small pustule R lower .  Teeth diffusely in poor condition with many caries, some teeth missing.   Eyes: Pupils are equal, round, and reactive to light. Conjunctivae and EOM are normal.  Neck: Neck supple. No thyromegaly present.  Cardiovascular: Normal rate and regular rhythm.   Pulmonary/Chest: Effort normal and breath sounds normal. He has no wheezes. He has no rales.  Abdominal: Soft. Bowel sounds are normal. He exhibits no mass.  There is no hepatosplenomegaly. There is no tenderness. There is no rigidity. A hernia is present. Hernia confirmed positive in the ventral area.  Large ventral hernia present which is nontender  Musculoskeletal: He exhibits no edema.  Lymphadenopathy:    He has no cervical adenopathy.  Neurological: He is alert and oriented to person, place, and time.  Skin: Skin is warm and dry. No rash noted.  Scar RLE at site of previous surgery appears healed without redness, purulence, tenderness  Psychiatric: His speech is normal and behavior is normal. Thought content normal. His affect is blunt and inappropriate.  Vitals reviewed.   Results for orders placed or performed in visit on 05/15/17  POCT glucose  Result Value Ref Range   POC Glucose 129 (A) 70 - 99 mg/dl      Assessment & Plan:   Encounter Diagnoses  Name Primary?  . Essential hypertension Yes  . Dental abscess   . Prediabetes   . Morbid obesity ([place])   . Umbilical hernia without obstruction and without gangrene   . Screening cholesterol level   . Screening for colon cancer   . Fatigue, unspecified type   . Impaired vision   . Chewing tobacco use      -pt was Counseled on cessation of chewing tobacco.  He was Counseled on dental hygiene.  rx amoxil for dental abscess.  Pt was put on Dental list -Encouraged pt to go to eye doctor further evaluation of his diminishingt vision -discussed hernia with pt including that losing some weight would help the hernia.  Also discussed when to be concerned about it- pain, other changes -Pt to get fasting baseline labs drawn tomorrow morning -Pt is given iFOBT for colon cancer screening -pt is counseled about his weight and things to help get it down (nutritious diet and regular exercise) to help make him healthier (help hernia, prediabetes, BP, etc) -rx lisinopril for bp -F/u 1 month. RTO sooner prn

## 2017-05-16 NOTE — Patient Instructions (Signed)
Dental Abscess A dental abscess is a collection of pus in or around a tooth. What are the causes? This condition is caused by a bacterial infection around the root of the tooth that involves the inner part of the tooth (pulp). It may result from:  Severe tooth decay.  Trauma to the tooth that allows bacteria to enter into the pulp, such as a broken or chipped tooth.  Severe gum disease around a tooth.  What are the signs or symptoms? Symptoms of this condition include:  Severe pain in and around the infected tooth.  Swelling and redness around the infected tooth, in the mouth, or in the face.  Tenderness.  Pus drainage.  Bad breath.  Bitter taste in the mouth.  Difficulty swallowing.  Difficulty opening the mouth.  Nausea.  Vomiting.  Chills.  Swollen neck glands.  Fever.  How is this diagnosed? This condition is diagnosed with examination of the infected tooth. During the exam, your dentist may tap on the infected tooth. Your dentist will also ask about your medical and dental history and may order X-rays. How is this treated? This condition is treated by eliminating the infection. This may be done with:  Antibiotic medicine.  A root canal. This may be performed to save the tooth.  Pulling (extracting) the tooth. This may also involve draining the abscess. This is done if the tooth cannot be saved.  Follow these instructions at home:  Take medicines only as directed by your dentist.  If you were prescribed antibiotic medicine, finish all of it even if you start to feel better.  Rinse your mouth (gargle) often with salt water to relieve pain or swelling.  Do not drive or operate heavy machinery while taking pain medicine.  Do not apply heat to the outside of your mouth.  Keep all follow-up visits as directed by your dentist. This is important. Contact a health care provider if:  Your pain is worse and is not helped by medicine. Get help right away  if:  You have a fever or chills.  Your symptoms suddenly get worse.  You have a very bad headache.  You have problems breathing or swallowing.  You have trouble opening your mouth.  You have swelling in your neck or around your eye. This information is not intended to replace advice given to you by your health care provider. Make sure you discuss any questions you have with your health care provider. Document Released: 09/26/2005 Document Revised: 02/04/2016 Document Reviewed: 09/23/2014 Elsevier Interactive Patient Education  2017 Elsevier Inc.  

## 2017-05-18 ENCOUNTER — Telehealth: Payer: Self-pay

## 2017-05-18 NOTE — Telephone Encounter (Signed)
Patient was here at the [facility] on August 6th,2018 for concerns of an abscess tooth, blood pressure and vision issues. [facility] and appointment was confirmed for August 7th,2018 at 9:15  Follow-up call was made to patient today August 9th,2018. Pt stated his tooth is feeling better and continuing to take antibiotic that was prescribed. Pt also mentioned having "migraines" due to the blood pressure medication that was prescribed and is taking them at night.   LPN advised patient to call the [facility] and voice his concern about medication. Pt stated he will give it another day and will call the [facility] if "migraines" persist.  Earnest Bailey R. Kemyra August LPN [phone]

## 2017-05-29 ENCOUNTER — Telehealth: Payer: Self-pay

## 2017-05-29 ENCOUNTER — Telehealth: Payer: Self-pay | Admitting: Physician Assistant

## 2017-05-29 NOTE — Telephone Encounter (Signed)
Pt made a phone call to the [facility] 05/30/15. Pt stated he wanted to see if he could be seen here at the [facility] because he was cutting a tree for his mother and got in contact with poison ivy on his eyes and ears. Pt also states his eyes are swelling, and that he has been trying to call the [facility] to be seen and left a detail messenger on there answering machine.  I voiced to patient that I will try to contact the [facility] and see if they can call him back.   After attempts of calling the [facility], the receptionist, returned call stating she just got in contact with the patient and advised patient to go to the ER.  Called the patient to make sure he spoke to someone from the [facility] and he said he had and  that he was at the ER in [place].  Tylie Golonka R. Otto Caraway LPN [phone]

## 2017-05-29 NOTE — Telephone Encounter (Addendum)
Patient was here at the [facility] on August 6th,2018 for concerns of an abscess tooth, blood pressure and vision issues. [facility] into [facility] and appointment was confirmed for August 7th,2018 at 9:15.  Pt made a phone call to the [facility] today 05/30/15. Pt stated he wanted to see if he could be seen here at the [facility] because he was cutting a tree for his mother and got in contact with poison ivy on his eyes and ears. Pt also states his eyes are swelling, and that he has been trying to call the [facility] to be seen and left a detail messenger on there answering machine.    I voiced to patient that I will try to contact the [facility] and see if they can call him back.     Mariya Mottley R. Casmere Hollenbeck LPN [phone]

## 2017-05-29 NOTE — Telephone Encounter (Signed)
Returned patients call in regards to an appointment for poison ivy. Patient stated his eyes were swelled and his vision was blurred. We had no provider or nurse here today. Patient was advised to seek treatment a the ER.

## 2017-06-19 ENCOUNTER — Ambulatory Visit: Payer: Self-pay | Admitting: Physician Assistant

## 2017-06-26 ENCOUNTER — Encounter: Payer: Self-pay | Admitting: Physician Assistant

## 2017-07-18 ENCOUNTER — Ambulatory Visit: Payer: Self-pay | Admitting: Physician Assistant

## 2017-07-18 ENCOUNTER — Encounter: Payer: Self-pay | Admitting: Physician Assistant

## 2017-07-18 VITALS — BP 136/90 | HR 89 | Temp 98.1°F | Ht 67.0 in | Wt 338.2 lb

## 2017-07-18 DIAGNOSIS — I10 Essential (primary) hypertension: Secondary | ICD-10-CM

## 2017-07-18 NOTE — Progress Notes (Signed)
BP 136/90 (BP Location: Left Arm, Patient Position: Sitting, Cuff Size: Normal)   Pulse 89   Temp 98.1 F (36.7 C)   Ht 5\' 7"  (1.702 m)   Wt (!) 338 lb 4 oz (153.4 kg)   SpO2 94%   BMI 52.98 kg/m    Subjective:    Patient ID: Leslie Atkinson, male    DOB: [birth year], 46 y.o.   MRN: [id number]  HPI: Leslie Atkinson is a 46 y.o. male presenting on 07/18/2017 for Hypertension (pt was out of medicine for about 10 days and just started taking again yesterday)   HPI Chief Complaint  Patient presents with  . Hypertension    pt was out of medicine for about 10 days and just started taking again yesterday    Pt is here today to recheck BP and review labs.  He did not get his labs drawn.   Relevant past medical, surgical, family and social history reviewed and updated as indicated. Interim medical history since our last visit reviewed. Allergies and medications reviewed and updated.   Current Outpatient Prescriptions:  .  ibuprofen (ADVIL,MOTRIN) 200 MG tablet, Take 3 tablets (600 mg total) by mouth every 6 (six) hours as needed., Disp: 30 tablet, Rfl: 0 .  lisinopril (PRINIVIL,ZESTRIL) 10 MG tablet, Take 1 tablet (10 mg total) by mouth daily., Disp: 30 tablet, Rfl: 1   Review of Systems  Constitutional: Positive for fatigue. Negative for appetite change, chills, diaphoresis, fever and unexpected weight change.  HENT: Positive for congestion and dental problem. Negative for drooling, ear pain, facial swelling, hearing loss, mouth sores, sneezing, sore throat, trouble swallowing and voice change.   Eyes: Positive for visual disturbance. Negative for pain, discharge, redness and itching.  Respiratory: Negative for cough, choking, shortness of breath and wheezing.   Cardiovascular: Negative for chest pain, palpitations and leg swelling.  Gastrointestinal: Negative for abdominal pain, blood in stool, constipation, diarrhea and vomiting.  Endocrine: Negative for cold intolerance, heat  intolerance and polydipsia.  Genitourinary: Negative for decreased urine volume, dysuria and hematuria.  Musculoskeletal: Positive for back pain. Negative for arthralgias and gait problem.  Skin: Negative for rash.  Allergic/Immunologic: Negative for environmental allergies.  Neurological: Negative for seizures, syncope, light-headedness and headaches.  Hematological: Negative for adenopathy.  Psychiatric/Behavioral: Negative for agitation, dysphoric mood and suicidal ideas. The patient is not nervous/anxious.     Per HPI unless specifically indicated above     Objective:    BP 136/90 (BP Location: Left Arm, Patient Position: Sitting, Cuff Size: Normal)   Pulse 89   Temp 98.1 F (36.7 C)   Ht 5\' 7"  (1.702 m)   Wt (!) 338 lb 4 oz (153.4 kg)   SpO2 94%   BMI 52.98 kg/m   Wt Readings from Last 3 Encounters:  07/18/17 (!) 338 lb 4 oz (153.4 kg)  05/16/17 (!) 336 lb 8 oz (152.6 kg)  05/15/17 (!) 335 lb 12.8 oz (152.3 kg)    Physical Exam  Constitutional: He is oriented to person, place, and time. He appears well-developed and well-nourished.  HENT:  Head: Normocephalic and atraumatic.  Neck: Neck supple.  Cardiovascular: Normal rate and regular rhythm.   Pulmonary/Chest: Effort normal and breath sounds normal. He has no wheezes.  Abdominal: Soft. Bowel sounds are normal. There is no hepatosplenomegaly. There is no tenderness.  Musculoskeletal: He exhibits no edema.  Lymphadenopathy:    He has no cervical adenopathy.  Neurological: He is alert and oriented to person, place, and  time.  Skin: Skin is warm and dry.  Psychiatric: He has a normal mood and affect. His behavior is normal.  Vitals reviewed.   l    Assessment & Plan:    Encounter Diagnoses  Name Primary?  . Essential hypertension Yes  . Morbid obesity ([place])     -counseled pt to avoid running out of his bp medications -counseled pt to get fasting labs drawn -pt to follow up 1 month.  RTO sooner prn

## 2017-07-19 LAB — IFOBT (OCCULT BLOOD): IFOBT: NEGATIVE

## 2017-08-21 ENCOUNTER — Ambulatory Visit: Payer: Self-pay | Admitting: Physician Assistant

## 2017-08-28 ENCOUNTER — Other Ambulatory Visit (HOSPITAL_COMMUNITY)
Admission: RE | Admit: 2017-08-28 | Discharge: 2017-08-28 | Disposition: A | Discharge status: Home / Self Care | Payer: Self-pay | Source: Ambulatory Visit | Attending: Physician Assistant | Admitting: Physician Assistant

## 2017-08-28 DIAGNOSIS — R7303 Prediabetes: Secondary | ICD-10-CM | POA: Insufficient documentation

## 2017-08-28 DIAGNOSIS — R5383 Other fatigue: Secondary | ICD-10-CM | POA: Insufficient documentation

## 2017-08-28 DIAGNOSIS — I10 Essential (primary) hypertension: Secondary | ICD-10-CM | POA: Insufficient documentation

## 2017-08-28 DIAGNOSIS — K047 Periapical abscess without sinus: Secondary | ICD-10-CM | POA: Insufficient documentation

## 2017-08-28 LAB — COMPREHENSIVE METABOLIC PANEL
ALT: 12 U/L — ABNORMAL LOW (ref 17–63)
ANION GAP: 7 (ref 5–15)
AST: 14 U/L — ABNORMAL LOW (ref 15–41)
Albumin: 3.6 g/dL (ref 3.5–5.0)
Alkaline Phosphatase: 83 U/L (ref 38–126)
BUN: 19 mg/dL (ref 6–20)
CALCIUM: 9 mg/dL (ref 8.9–10.3)
CHLORIDE: 100 mmol/L — AB (ref 101–111)
CO2: 30 mmol/L (ref 22–32)
Creatinine, Ser: 0.87 mg/dL (ref 0.61–1.24)
GFR calc non Af Amer: >60 mL/min (ref >60)
Glucose, Bld: 97 mg/dL (ref 65–99)
POTASSIUM: 4.8 mmol/L (ref 3.5–5.1)
Sodium: 137 mmol/L (ref 135–145)
Total Bilirubin: 0.4 mg/dL (ref 0.3–1.2)
Total Protein: 6.9 g/dL (ref 6.5–8.1)

## 2017-08-28 LAB — CBC
HEMATOCRIT: 52.1 % — AB (ref 39.0–52.0)
HEMOGLOBIN: 16.5 g/dL (ref 13.0–17.0)
MCH: 31.1 pg (ref 26.0–34.0)
MCHC: 31.7 g/dL (ref 30.0–36.0)
MCV: 98.1 fL (ref 78.0–100.0)
PLATELETS: 227 10*3/uL (ref 150–400)
RBC: 5.31 MIL/uL (ref 4.22–5.81)
RDW: 13.2 % (ref 11.5–15.5)
WBC: 7.9 10*3/uL (ref 4.0–10.5)

## 2017-08-28 LAB — HEMOGLOBIN A1C
HEMOGLOBIN A1C: 5.7 % — AB (ref 4.8–5.6)
MEAN PLASMA GLUCOSE: 116.89 mg/dL

## 2017-08-28 LAB — LIPID PANEL
CHOL/HDL RATIO: 3.6 ratio
Cholesterol: 167 mg/dL (ref 0–200)
HDL: 46 mg/dL (ref >40)
LDL Cholesterol: 84 mg/dL (ref 0–99)
Triglycerides: 184 mg/dL — ABNORMAL HIGH (ref <150)
VLDL: 37 mg/dL (ref 0–40)

## 2017-08-28 LAB — TSH: TSH: 1.874 u[IU]/mL (ref 0.350–4.500)

## 2017-08-29 ENCOUNTER — Ambulatory Visit: Payer: Self-pay | Admitting: Physician Assistant

## 2017-08-29 ENCOUNTER — Encounter: Payer: Self-pay | Admitting: Physician Assistant

## 2017-08-29 ENCOUNTER — Other Ambulatory Visit: Payer: Self-pay | Admitting: Physician Assistant

## 2017-08-29 VITALS — BP 140/86 | HR 89 | Temp 98.2°F | Ht 67.0 in | Wt 332.2 lb

## 2017-08-29 DIAGNOSIS — R7303 Prediabetes: Secondary | ICD-10-CM

## 2017-08-29 DIAGNOSIS — B354 Tinea corporis: Secondary | ICD-10-CM

## 2017-08-29 DIAGNOSIS — E785 Hyperlipidemia, unspecified: Secondary | ICD-10-CM

## 2017-08-29 DIAGNOSIS — I10 Essential (primary) hypertension: Secondary | ICD-10-CM

## 2017-08-29 MED ORDER — NYSTATIN 100000 UNIT/GM EX CREA: 1.0000 "application " | TOPICAL_CREAM | Freq: Two times a day (BID) | CUTANEOUS | 0 refills | Status: DC

## 2017-08-29 MED ORDER — LISINOPRIL 20 MG PO TABS: 20.0000 mg | ORAL_TABLET | Freq: Every day | ORAL | 3 refills | Status: DC

## 2017-08-29 NOTE — Progress Notes (Signed)
BP 140/86 (BP Location: Left Arm, Patient Position: Sitting, Cuff Size: Normal)   Pulse 89   Temp 98.2 F (36.8 C)   Ht 5\' 7"  (1.702 m)   Wt (!) 332 lb 4 oz (150.7 kg)   SpO2 96%   BMI 52.04 kg/m    Subjective:    Patient ID: Leslie Atkinson, male    DOB: [birth year], 46 y.o.   MRN: [id number]  HPI: Leslie Atkinson is a 46 y.o. male presenting on 08/29/2017 for Follow-up   HPI Pt hasn't missed a dose of his bp meds for several days.  He says he is doing well.   Relevant past medical, surgical, family and social history reviewed and updated as indicated. Interim medical history since our last visit reviewed. Allergies and medications reviewed and updated.   Current Outpatient Medications:  .  ibuprofen (ADVIL,MOTRIN) 200 MG tablet, Take 3 tablets (600 mg total) by mouth every 6 (six) hours as needed., Disp: 30 tablet, Rfl: 0 .  lisinopril (PRINIVIL,ZESTRIL) 10 MG tablet, Take 1 tablet (10 mg total) by mouth daily., Disp: 30 tablet, Rfl: 1   Review of Systems  Constitutional: Negative for appetite change, chills, diaphoresis, fatigue, fever and unexpected weight change.  HENT: Positive for drooling. Negative for congestion, dental problem, ear pain, facial swelling, hearing loss, mouth sores, sneezing, sore throat, trouble swallowing and voice change.   Eyes: Negative for pain, discharge, redness, itching and visual disturbance.  Respiratory: Negative for cough, choking, shortness of breath and wheezing.   Cardiovascular: Negative for chest pain, palpitations and leg swelling.  Gastrointestinal: Negative for abdominal pain, blood in stool, constipation, diarrhea and vomiting.  Endocrine: Negative for cold intolerance, heat intolerance and polydipsia.  Genitourinary: Negative for decreased urine volume, dysuria and hematuria.  Musculoskeletal: Positive for back pain. Negative for arthralgias and gait problem.  Skin: Positive for rash.  Allergic/Immunologic: Negative for  environmental allergies.  Neurological: Negative for seizures, syncope, light-headedness and headaches.  Hematological: Negative for adenopathy.  Psychiatric/Behavioral: Negative for agitation, dysphoric mood and suicidal ideas. The patient is not nervous/anxious.     Per HPI unless specifically indicated above     Objective:    BP 140/86 (BP Location: Left Arm, Patient Position: Sitting, Cuff Size: Normal)   Pulse 89   Temp 98.2 F (36.8 C)   Ht 5\' 7"  (1.702 m)   Wt (!) 332 lb 4 oz (150.7 kg)   SpO2 96%   BMI 52.04 kg/m   Wt Readings from Last 3 Encounters:  08/29/17 (!) 332 lb 4 oz (150.7 kg)  07/18/17 (!) 338 lb 4 oz (153.4 kg)  05/16/17 (!) 336 lb 8 oz (152.6 kg)    Physical Exam  Constitutional: He is oriented to person, place, and time. He appears well-developed and well-nourished.  HENT:  Head: Normocephalic and atraumatic.  Neck: Neck supple.  Cardiovascular: Normal rate and regular rhythm.  Pulmonary/Chest: Effort normal and breath sounds normal. He has no wheezes.  Abdominal: Soft. Bowel sounds are normal. There is no hepatosplenomegaly. There is no tenderness.  Musculoskeletal: He exhibits no edema.  Lymphadenopathy:    He has no cervical adenopathy.  Neurological: He is alert and oriented to person, place, and time.  Skin: Skin is warm and dry. Rash noted.  Pt has spot on L forearm- dry flaky well-demarcated  Psychiatric: He has a normal mood and affect. His behavior is normal.  Vitals reviewed.   Results for orders placed or performed during the hospital encounter of 08/28/17  Lipid Profile  Result Value Ref Range   Cholesterol 167 0 - 200 mg/dL   Triglycerides 184 (H) <150 mg/dL   HDL 46 >40 mg/dL   Total CHOL/HDL Ratio 3.6 RATIO   VLDL 37 0 - 40 mg/dL   LDL Cholesterol 84 0 - 99 mg/dL  TSH  Result Value Ref Range   TSH 1.874 0.350 - 4.500 uIU/mL  HgB A1c  Result Value Ref Range   Hgb A1c MFr Bld 5.7 (H) 4.8 - 5.6 %   Mean Plasma Glucose 116.89  mg/dL  Comprehensive Metabolic Panel (CMET)  Result Value Ref Range   Sodium 137 135 - 145 mmol/L   Potassium 4.8 3.5 - 5.1 mmol/L   Chloride 100 (L) 101 - 111 mmol/L   CO2 30 22 - 32 mmol/L   Glucose, Bld 97 65 - 99 mg/dL   BUN 19 6 - 20 mg/dL   Creatinine, Ser 0.87 0.61 - 1.24 mg/dL   Calcium 9.0 8.9 - 10.3 mg/dL   Total Protein 6.9 6.5 - 8.1 g/dL   Albumin 3.6 3.5 - 5.0 g/dL   AST 14 (L) 15 - 41 U/L   ALT 12 (L) 17 - 63 U/L   Alkaline Phosphatase 83 38 - 126 U/L   Total Bilirubin 0.4 0.3 - 1.2 mg/dL   GFR calc non Af Amer >60 >60 mL/min   GFR calc Af Amer >60 >60 mL/min   Anion gap 7 5 - 15  CBC  Result Value Ref Range   WBC 7.9 4.0 - 10.5 K/uL   RBC 5.31 4.22 - 5.81 MIL/uL   Hemoglobin 16.5 13.0 - 17.0 g/dL   HCT 52.1 (H) 39.0 - 52.0 %   MCV 98.1 78.0 - 100.0 fL   MCH 31.1 26.0 - 34.0 pg   MCHC 31.7 30.0 - 36.0 g/dL   RDW 13.2 11.5 - 15.5 %   Platelets 227 150 - 400 K/uL      Assessment & Plan:   Encounter Diagnoses  Name Primary?  . Essential hypertension Yes  . Morbid obesity ([place])   . Hyperlipidemia, unspecified hyperlipidemia type   . Prediabetes   . Tinea corporis     -Reviewed labs with pt -Counseled on pre-diabetes -Counseled and gave low-fat diet -Increased lisinopril -rx nystatin cream (L forearm) -pt to RTO 1 month to recheck bp.  RTO sooner prn

## 2017-08-29 NOTE — Patient Instructions (Signed)
Fat and Cholesterol Restricted Diet High levels of fat and cholesterol in your blood may lead to various health problems, such as diseases of the heart, blood vessels, gallbladder, liver, and pancreas. Fats are concentrated sources of energy that come in various forms. Certain types of fat, including saturated fat, may be harmful in excess. Cholesterol is a substance needed by your body in small amounts. Your body makes all the cholesterol it needs. Excess cholesterol comes from the food you eat. When you have high levels of cholesterol and saturated fat in your blood, health problems can develop because the excess fat and cholesterol will gather along the walls of your blood vessels, causing them to narrow. Choosing the right foods will help you control your intake of fat and cholesterol. This will help keep the levels of these substances in your blood within normal limits and reduce your risk of disease. What is my plan? Your health care provider recommends that you:  Limit your fat intake to ______% or less of your total calories per day.  Limit the amount of cholesterol in your diet to less than _________mg per day.  Eat 20-30 grams of fiber each day.  What types of fat should I choose?  Choose healthy fats more often. Choose monounsaturated and polyunsaturated fats, such as olive and canola oil, flaxseeds, walnuts, almonds, and seeds.  Eat more omega-3 fats. Good choices include salmon, mackerel, sardines, tuna, flaxseed oil, and ground flaxseeds. Aim to eat fish at least two times a week.  Limit saturated fats. Saturated fats are primarily found in animal products, such as meats, butter, and cream. Plant sources of saturated fats include palm oil, palm kernel oil, and coconut oil.  Avoid foods with partially hydrogenated oils in them. These contain trans fats. Examples of foods that contain trans fats are stick margarine, some tub margarines, cookies, crackers, and other baked goods. What  general guidelines do I need to follow? These guidelines for healthy eating will help you control your intake of fat and cholesterol:  Check food labels carefully to identify foods with trans fats or high amounts of saturated fat.  Fill one half of your plate with vegetables and green salads.  Fill one fourth of your plate with whole grains. Look for the word "whole" as the first word in the ingredient list.  Fill one fourth of your plate with lean protein foods.  Limit fruit to two servings a day. Choose fruit instead of juice.  Eat more foods that contain fiber, such as apples, broccoli, carrots, beans, peas, and barley.  Eat more home-cooked food and less restaurant, buffet, and fast food.  Limit or avoid alcohol.  Limit foods high in starch and sugar.  Limit fried foods.  Cook foods using methods other than frying. Baking, boiling, grilling, and broiling are all great options.  Lose weight if you are overweight. Losing just 5-10% of your initial body weight can help your overall health and prevent diseases such as diabetes and heart disease.  What foods can I eat? Grains  Whole grains, such as whole wheat or whole grain breads, crackers, cereals, and pasta. Unsweetened oatmeal, bulgur, barley, quinoa, or brown rice. Corn or whole wheat flour tortillas. Vegetables  Fresh or frozen vegetables (raw, steamed, roasted, or grilled). Green salads. Fruits  All fresh, canned (in natural juice), or frozen fruits. Meats and other protein foods  Ground beef (85% or leaner), grass-fed beef, or beef trimmed of fat. Skinless chicken or turkey. Ground chicken or turkey.   Pork trimmed of fat. All fish and seafood. Eggs. Dried beans, peas, or lentils. Unsalted nuts or seeds. Unsalted canned or dry beans. Dairy  Low-fat dairy products, such as skim or 1% milk, 2% or reduced-fat cheeses, low-fat ricotta or cottage cheese, or plain low-fat yo Fats and oils  Tub margarines without trans  fats. Light or reduced-fat mayonnaise and salad dressings. Avocado. Olive, canola, sesame, or safflower oils. Natural peanut or almond butter (choose ones without added sugar and oil). The items listed above may not be a complete list of recommended foods or beverages. Contact your dietitian for more options. Foods to avoid Grains  White bread. White pasta. White rice. Cornbread. Bagels, pastries, and croissants. Crackers that contain trans fat. Vegetables  White potatoes. Corn. Creamed or fried vegetables. Vegetables in a cheese sauce. Fruits  Dried fruits. Canned fruit in light or heavy syrup. Fruit juice. Meats and other protein foods  Fatty cuts of meat. Ribs, chicken wings, bacon, sausage, bologna, salami, chitterlings, fatback, hot dogs, bratwurst, and packaged luncheon meats. Liver and organ meats. Dairy  Whole or 2% milk, cream, half-and-half, and cream cheese. Whole milk cheeses. Whole-fat or sweetened yogurt. Full-fat cheeses. Nondairy creamers and whipped toppings. Processed cheese, cheese spreads, or cheese curds. Beverages  Alcohol. Sweetened drinks (such as sodas, lemonade, and fruit drinks or punches). Fats and oils  Butter, stick margarine, lard, shortening, ghee, or bacon fat. Coconut, palm kernel, or palm oils. Sweets and desserts  Corn syrup, sugars, honey, and molasses. Candy. Jam and jelly. Syrup. Sweetened cereals. Cookies, pies, cakes, donuts, muffins, and ice cream. The items listed above may not be a complete list of foods and beverages to avoid. Contact your dietitian for more information. This information is not intended to replace advice given to you by your health care provider. Make sure you discuss any questions you have with your health care provider. Document Released: 09/26/2005 Document Revised: 10/17/2014 Document Reviewed: 12/25/2013 Elsevier Interactive Patient Education  2017 [employer] Inc[employer]  

## 2017-09-26 ENCOUNTER — Ambulatory Visit: Payer: Self-pay | Admitting: Physician Assistant

## 2017-11-14 ENCOUNTER — Encounter: Payer: Self-pay | Admitting: Physician Assistant

## 2017-11-14 ENCOUNTER — Ambulatory Visit: Payer: Self-pay | Admitting: Physician Assistant

## 2017-11-14 VITALS — BP 130/70 | HR 97 | Temp 100.6°F | Ht 67.0 in | Wt 342.5 lb

## 2017-11-14 DIAGNOSIS — I10 Essential (primary) hypertension: Secondary | ICD-10-CM

## 2017-11-14 DIAGNOSIS — J069 Acute upper respiratory infection, unspecified: Secondary | ICD-10-CM

## 2017-11-14 NOTE — Progress Notes (Signed)
BP 130/70 (BP Location: Left Arm, Patient Position: Sitting, Cuff Size: Large)   Pulse 97   Temp (!) 100.6 F (38.1 C)   Ht 5\' 7"  (1.702 m)   Wt (!) 342 lb 8 oz (155.4 kg)   SpO2 93%   BMI 53.64 kg/m    Subjective:    Patient ID: Leslie Atkinson, male    DOB: 12/05/1970, 47 y.o.   MRN: [id number]  HPI: Leslie Atkinson is a 47 y.o. male presenting on 11/14/2017 for Hypertension (pt states not feeling too good. pt states having congestion, HA, cough, thick mucous, feeling tired. pt states began this morning.)   HPI   Pt just started feeling bad today  Pt feels weak. No body aches.   Relevant past medical, surgical, family and social history reviewed and updated as indicated. Interim medical history since our last visit reviewed. Allergies and medications reviewed and updated.   Current Outpatient Medications:  .  ibuprofen (ADVIL,MOTRIN) 200 MG tablet, Take 3 tablets (600 mg total) by mouth every 6 (six) hours as needed., Disp: 30 tablet, Rfl: 0 .  lisinopril (PRINIVIL,ZESTRIL) 20 MG tablet, Take 1 tablet (20 mg total) by mouth daily., Disp: 30 tablet, Rfl: 3 .  nystatin cream (MYCOSTATIN), Apply 1 application topically 2 (two) times daily. (Patient not taking: Reported on 11/14/2017), Disp: 30 g, Rfl: 0   Review of Systems  Constitutional: Positive for fatigue. Negative for appetite change, chills, diaphoresis, fever and unexpected weight change.  HENT: Positive for congestion. Negative for dental problem, drooling, ear pain, facial swelling, hearing loss, mouth sores, sneezing, sore throat, trouble swallowing and voice change.   Eyes: Negative for pain, discharge, redness, itching and visual disturbance.  Respiratory: Positive for cough and wheezing. Negative for choking and shortness of breath.   Cardiovascular: Negative for chest pain, palpitations and leg swelling.  Gastrointestinal: Negative for abdominal pain, blood in stool, constipation, diarrhea and vomiting.  Endocrine:  Negative for cold intolerance, heat intolerance and polydipsia.  Genitourinary: Negative for decreased urine volume, dysuria and hematuria.  Musculoskeletal: Negative for arthralgias, back pain and gait problem.  Skin: Negative for rash.  Allergic/Immunologic: Negative for environmental allergies.  Neurological: Negative for seizures, syncope, light-headedness and headaches.  Hematological: Negative for adenopathy.  Psychiatric/Behavioral: Negative for agitation, dysphoric mood and suicidal ideas. The patient is not nervous/anxious.     Per HPI unless specifically indicated above     Objective:    BP 130/70 (BP Location: Left Arm, Patient Position: Sitting, Cuff Size: Large)   Pulse 97   Temp (!) 100.6 F (38.1 C)   Ht 5\' 7"  (1.702 m)   Wt (!) 342 lb 8 oz (155.4 kg)   SpO2 93%   BMI 53.64 kg/m   Wt Readings from Last 3 Encounters:  11/14/17 (!) 342 lb 8 oz (155.4 kg)  08/29/17 (!) 332 lb 4 oz (150.7 kg)  07/18/17 (!) 338 lb 4 oz (153.4 kg)    Physical Exam  Constitutional: He is oriented to person, place, and time. He appears well-developed and well-nourished.  Pt looks like he doesn't feel well  HENT:  Head: Normocephalic and atraumatic.  Right Ear: Hearing, tympanic membrane, external ear and ear canal normal.  Left Ear: Hearing, tympanic membrane, external ear and ear canal normal.  Nose: Rhinorrhea present.  Mouth/Throat: Uvula is midline and oropharynx is clear and moist. No uvula swelling. No oropharyngeal exudate, posterior oropharyngeal edema, posterior oropharyngeal erythema or tonsillar abscesses.  Mild nasal cong  Neck: Neck  supple.  Cardiovascular: Normal rate and regular rhythm.  Pulmonary/Chest: Effort normal and breath sounds normal. He has no wheezes.  Abdominal: Soft. Bowel sounds are normal. There is no hepatosplenomegaly. There is no tenderness.  Musculoskeletal: He exhibits no edema.  Lymphadenopathy:    He has no cervical adenopathy.  Neurological:  He is alert and oriented to person, place, and time.  Skin: Skin is warm and dry.  Psychiatric: He has a normal mood and affect. His behavior is normal.  Vitals reviewed.       Assessment & Plan:    Encounter Diagnoses  Name Primary?  . Essential hypertension Yes  . Viral upper respiratory illness   . Morbid obesity ([place])     -counseled pt likely viral illness.  Encouraged rest, fluids, APAP or IBU prn -pt to continue current medications -pt to follow up 3 months.  He is to RTO sooner if he develops new symptoms or fails to improve

## 2017-11-14 NOTE — Patient Instructions (Signed)
Fever, Adult A fever is an increase in the body's temperature. It is usually defined as a temperature of 100F (38C) or higher. Brief mild or moderate fevers generally have no long-term effects, and they often do not require treatment. Moderate or high fevers may make you feel uncomfortable and can sometimes be a sign of a serious illness or disease. The sweating that may occur with repeated or prolonged fever may also cause dehydration. Fever is confirmed by taking a temperature with a thermometer. A measured temperature can vary with:  Age.  Time of day.  Location of the thermometer: ? Mouth (oral). ? Rectum (rectal). ? Ear (tympanic). ? Underarm (axillary). ? Forehead (temporal).  Follow these instructions at home: Pay attention to any changes in your symptoms. Take these actions to help with your condition:  Take over-the counter and prescription medicines only as told by your health care provider. Follow the dosing instructions carefully.  If you were prescribed an antibiotic medicine, take it as told by your health care provider. Do not stop taking the antibiotic even if you start to feel better.  Rest as needed.  Drink enough fluid to keep your urine clear or pale yellow. This helps to prevent dehydration.  Sponge yourself or bathe with room-temperature water to help reduce your body temperature as needed. Do not use ice water.  Do not overbundle yourself in blankets or heavy clothes.  Contact a health care provider if:  You vomit.  You cannot eat or drink without vomiting.  You have diarrhea.  You have pain when you urinate.  Your symptoms do not improve with treatment.  You develop new symptoms.  You develop excessive weakness. Get help right away if:  You have shortness of breath or have trouble breathing.  You are dizzy or you faint.  You are disoriented or confused.  You develop signs of dehydration, such as a dry mouth, decreased urination, or  paleness.  You develop severe pain in your abdomen.  You have persistent vomiting or diarrhea.  You develop a skin rash.  Your symptoms suddenly get worse. This information is not intended to replace advice given to you by your health care provider. Make sure you discuss any questions you have with your health care provider. Document Released: 03/22/2001 Document Revised: 03/03/2016 Document Reviewed: 11/20/2014 Elsevier Interactive Patient Education  2018 [employer] Inc.  

## 2017-11-16 ENCOUNTER — Ambulatory Visit: Payer: Self-pay | Admitting: Physician Assistant

## 2017-11-16 ENCOUNTER — Other Ambulatory Visit: Payer: Self-pay | Admitting: Physician Assistant

## 2017-11-16 ENCOUNTER — Ambulatory Visit (HOSPITAL_COMMUNITY)
Admission: RE | Admit: 2017-11-16 | Discharge: 2017-11-16 | Disposition: A | Discharge status: Home / Self Care | Payer: Self-pay | Source: Ambulatory Visit | Attending: Physician Assistant | Admitting: Physician Assistant

## 2017-11-16 ENCOUNTER — Encounter: Payer: Self-pay | Admitting: Physician Assistant

## 2017-11-16 VITALS — BP 106/60 | HR 101 | Temp 98.4°F | Ht 67.0 in | Wt 340.8 lb

## 2017-11-16 DIAGNOSIS — R059 Cough, unspecified: Secondary | ICD-10-CM

## 2017-11-16 DIAGNOSIS — R05 Cough: Secondary | ICD-10-CM | POA: Insufficient documentation

## 2017-11-16 DIAGNOSIS — R509 Fever, unspecified: Secondary | ICD-10-CM | POA: Insufficient documentation

## 2017-11-16 DIAGNOSIS — I517 Cardiomegaly: Secondary | ICD-10-CM | POA: Insufficient documentation

## 2017-11-16 DIAGNOSIS — J209 Acute bronchitis, unspecified: Secondary | ICD-10-CM

## 2017-11-16 MED ORDER — BENZONATATE 100 MG PO CAPS: ORAL_CAPSULE | ORAL | 3 refills | Status: DC

## 2017-11-16 MED ORDER — AZITHROMYCIN 250 MG PO TABS: ORAL_TABLET | ORAL | 0 refills | Status: DC

## 2017-11-16 NOTE — Progress Notes (Signed)
BP 106/60 (BP Location: Left Arm, Patient Position: Sitting, Cuff Size: Large)   Pulse (!) 101   Temp 98.4 F (36.9 C)   Ht 5\' 7"  (1.702 m)   Wt (!) 340 lb 12 oz (154.6 kg)   SpO2 95%   BMI 53.37 kg/m    Subjective:    Patient ID: Leslie Atkinson, male    DOB: 10-17-1970, 47 y.o.   MRN: [id number]  HPI: Leslie Atkinson is a 47 y.o. male presenting on 11/16/2017 for Fever (pt taking tylenol 500 mg and mucinex. pt states temp this morning was 101F. pt states he last took tylenol at 7:30 this morning)   HPI   Chief Complaint  Patient presents with  . Fever    pt taking tylenol 500 mg and mucinex. pt states temp this morning was 101F. pt states he last took tylenol at 7:30 this morning    Pt seen here earlier this week and send home with dx viral illness.  Pt complains of cough seems worse and fever persists.  Still no body aches.  His ribs hurt bad every time he coughs.  Fever this a.m.  He took tylenol.    CXR done today but not read by time of appointment.  CXR reviewed by myself and appears to have no visible pneumonia.   Relevant past medical, surgical, family and social history reviewed and updated as indicated. Interim medical history since our last visit reviewed. Allergies and medications reviewed and updated.  Review of Systems  Constitutional: Positive for fever. Negative for appetite change, chills, diaphoresis, fatigue and unexpected weight change.  HENT: Positive for congestion and sneezing. Negative for dental problem, drooling, ear pain, facial swelling, hearing loss, mouth sores, sore throat, trouble swallowing and voice change.   Eyes: Negative for pain, discharge, redness, itching and visual disturbance.  Respiratory: Positive for cough, shortness of breath and wheezing. Negative for choking.   Cardiovascular: Negative for chest pain, palpitations and leg swelling.  Gastrointestinal: Negative for abdominal pain, blood in stool, constipation, diarrhea and vomiting.   Endocrine: Negative for cold intolerance, heat intolerance and polydipsia.  Genitourinary: Negative for decreased urine volume, dysuria and hematuria.  Musculoskeletal: Negative for arthralgias, back pain and gait problem.  Skin: Negative for rash.  Allergic/Immunologic: Negative for environmental allergies.  Neurological: Positive for light-headedness. Negative for seizures, syncope and headaches.  Hematological: Negative for adenopathy.  Psychiatric/Behavioral: Negative for agitation, dysphoric mood and suicidal ideas. The patient is not nervous/anxious.     Per HPI unless specifically indicated above     Objective:    BP 106/60 (BP Location: Left Arm, Patient Position: Sitting, Cuff Size: Large)   Pulse (!) 101   Temp 98.4 F (36.9 C)   Ht 5\' 7"  (1.702 m)   Wt (!) 340 lb 12 oz (154.6 kg)   SpO2 95%   BMI 53.37 kg/m   Wt Readings from Last 3 Encounters:  11/16/17 (!) 340 lb 12 oz (154.6 kg)  11/14/17 (!) 342 lb 8 oz (155.4 kg)  08/29/17 (!) 332 lb 4 oz (150.7 kg)    Physical Exam  Constitutional: He is oriented to person, place, and time. He appears well-developed and well-nourished.  Non-toxic appearance. He has a sickly appearance. No distress.  Looks sick.    HENT:  Head: Normocephalic and atraumatic.  Right Ear: Hearing, tympanic membrane, external ear and ear canal normal.  Left Ear: Hearing, tympanic membrane, external ear and ear canal normal.  Nose: Nose normal.  Mouth/Throat: Uvula  is midline and oropharynx is clear and moist. No uvula swelling. No oropharyngeal exudate, posterior oropharyngeal edema, posterior oropharyngeal erythema or tonsillar abscesses.  Neck: Neck supple.  Cardiovascular: Normal rate and regular rhythm.  Pulmonary/Chest: Effort normal and breath sounds normal. He has no wheezes.  Abdominal: Soft. Bowel sounds are normal. There is no hepatosplenomegaly. There is no tenderness.  Musculoskeletal: He exhibits no edema.  Lymphadenopathy:    He  has no cervical adenopathy.  Neurological: He is alert and oriented to person, place, and time.  Skin: Skin is warm and dry.  Psychiatric: He has a normal mood and affect. His behavior is normal.  Vitals reviewed.       Assessment & Plan:   Encounter Diagnosis  Name Primary?  . Acute bronchitis, unspecified organism Yes    -discussed cxr findings by my reading.  Told pt we would call him next week with results as read by the radiologist -rx zpack and tesslon -pt counseled to Rest. Drink plenty Fluids -APAP or IBU prn -RTO if fails to resolve or for new symptoms

## 2018-02-12 ENCOUNTER — Other Ambulatory Visit (HOSPITAL_COMMUNITY)
Admission: RE | Admit: 2018-02-12 | Discharge: 2018-02-12 | Disposition: A | Discharge status: Home / Self Care | Payer: Self-pay | Source: Ambulatory Visit | Attending: Physician Assistant | Admitting: Physician Assistant

## 2018-02-12 DIAGNOSIS — I10 Essential (primary) hypertension: Secondary | ICD-10-CM | POA: Insufficient documentation

## 2018-02-12 LAB — BASIC METABOLIC PANEL
Anion gap: 9 (ref 5–15)
BUN: 14 mg/dL (ref 6–20)
CALCIUM: 8.5 mg/dL — AB (ref 8.9–10.3)
CO2: 27 mmol/L (ref 22–32)
CREATININE: 0.84 mg/dL (ref 0.61–1.24)
Chloride: 100 mmol/L — ABNORMAL LOW (ref 101–111)
GFR calc Af Amer: >60 mL/min (ref >60)
GFR calc non Af Amer: >60 mL/min (ref >60)
GLUCOSE: 91 mg/dL (ref 65–99)
Potassium: 4.5 mmol/L (ref 3.5–5.1)
SODIUM: 136 mmol/L (ref 135–145)

## 2018-02-13 ENCOUNTER — Ambulatory Visit: Payer: Self-pay | Admitting: Physician Assistant

## 2018-02-13 VITALS — BP 138/81 | HR 81 | Temp 98.2°F | Ht 67.0 in | Wt 361.0 lb

## 2018-02-13 DIAGNOSIS — R5383 Other fatigue: Secondary | ICD-10-CM

## 2018-02-13 DIAGNOSIS — I10 Essential (primary) hypertension: Secondary | ICD-10-CM

## 2018-02-13 NOTE — Progress Notes (Signed)
BP 138/81 (BP Location: Right Arm, Patient Position: Sitting, Cuff Size: Normal)   Pulse 81   Temp 98.2 F (36.8 C)   Ht 5\' 7"  (1.702 m)   Wt (!) 361 lb (163.7 kg)   SpO2 93%   BMI 56.54 kg/m    Subjective:    Patient ID: Leslie Atkinson, male    DOB: Apr 14, 1971, 47 y.o.   MRN: [id number]  HPI: Leslie Atkinson is a 47 y.o. male presenting on 02/13/2018 for Hypertension   HPI  Pt says he has been not eating well recently.   His mother has stage 4 metastatic lung cancer.   He complains being sleepy all the time  Relevant past medical, surgical, family and social history reviewed and updated as indicated. Interim medical history since our last visit reviewed. Allergies and medications reviewed and updated.   Current Outpatient Medications:  .  lisinopril (PRINIVIL,ZESTRIL) 20 MG tablet, Take 1 tablet (20 mg total) by mouth daily., Disp: 30 tablet, Rfl: 3   Review of Systems  Constitutional: Positive for fatigue. Negative for appetite change, chills, diaphoresis, fever and unexpected weight change.  HENT: Positive for dental problem and drooling. Negative for congestion, ear pain, facial swelling, hearing loss, mouth sores, sneezing, sore throat, trouble swallowing and voice change.   Eyes: Negative for pain, discharge, redness, itching and visual disturbance.  Respiratory: Negative for cough, choking, shortness of breath and wheezing.   Cardiovascular: Negative for chest pain, palpitations and leg swelling.  Gastrointestinal: Negative for abdominal pain, blood in stool, constipation, diarrhea and vomiting.  Endocrine: Negative for cold intolerance, heat intolerance and polydipsia.  Genitourinary: Negative for decreased urine volume, dysuria and hematuria.  Musculoskeletal: Negative for arthralgias, back pain and gait problem.  Skin: Negative for rash.  Allergic/Immunologic: Negative for environmental allergies.  Neurological: Negative for seizures, syncope, light-headedness  and headaches.  Hematological: Negative for adenopathy.  Psychiatric/Behavioral: Negative for agitation, dysphoric mood and suicidal ideas. The patient is not nervous/anxious.     Per HPI unless specifically indicated above     Objective:    BP 138/81 (BP Location: Right Arm, Patient Position: Sitting, Cuff Size: Normal)   Pulse 81   Temp 98.2 F (36.8 C)   Ht 5\' 7"  (1.702 m)   Wt (!) 361 lb (163.7 kg)   SpO2 93%   BMI 56.54 kg/m   Wt Readings from Last 3 Encounters:  02/13/18 (!) 361 lb (163.7 kg)  11/16/17 (!) 340 lb 12 oz (154.6 kg)  11/14/17 (!) 342 lb 8 oz (155.4 kg)    Physical Exam  Constitutional: He is oriented to person, place, and time. He appears well-developed and well-nourished.  HENT:  Head: Normocephalic and atraumatic.  Neck: Neck supple.  Cardiovascular: Normal rate and regular rhythm.  Pulmonary/Chest: Effort normal and breath sounds normal. He has no wheezes.  Abdominal: Soft. Bowel sounds are normal. There is no hepatosplenomegaly. There is no tenderness.  Musculoskeletal: He exhibits no edema.  Lymphadenopathy:    He has no cervical adenopathy.  Neurological: He is alert and oriented to person, place, and time.  Skin: Skin is warm and dry.  Psychiatric: He has a normal mood and affect. His behavior is normal.  Vitals reviewed.   Results for orders placed or performed during the hospital encounter of 57/32/20  Basic metabolic panel  Result Value Ref Range   Sodium 136 135 - 145 mmol/L   Potassium 4.5 3.5 - 5.1 mmol/L   Chloride 100 (L) 101 - 111 mmol/L  CO2 27 22 - 32 mmol/L   Glucose, Bld 91 65 - 99 mg/dL   BUN 14 6 - 20 mg/dL   Creatinine, Ser 0.84 0.61 - 1.24 mg/dL   Calcium 8.5 (L) 8.9 - 10.3 mg/dL   GFR calc non Af Amer >60 >60 mL/min   GFR calc Af Amer >60 >60 mL/min   Anion gap 9 5 - 15      Assessment & Plan:   Encounter Diagnoses  Name Primary?  . Essential hypertension Yes  . Fatigue, unspecified type   . Morbid obesity  ([place])      -Pt to continue current medications -counseled pt on watching diet to help BP -pt given phone number for financial counseler so he can check on the application for charity care he submitted in February -if he is approved, he will notify office so a sleep study can be ordered to evaluate for sleep apnea  --counseled pt on weight loss which would help his BP and his fatigue and OSA -pt to RTO 6 weeks to recheck BP.  RTO sooner prn

## 2018-02-13 NOTE — Patient Instructions (Signed)
Financial counselor- [phone] (? [facility])

## 2018-03-12 ENCOUNTER — Encounter: Payer: Self-pay | Admitting: Physician Assistant

## 2018-03-12 ENCOUNTER — Ambulatory Visit: Payer: Self-pay | Admitting: Physician Assistant

## 2018-03-12 VITALS — BP 160/72 | HR 91 | Temp 97.9°F | Wt 370.5 lb

## 2018-03-12 DIAGNOSIS — K0889 Other specified disorders of teeth and supporting structures: Secondary | ICD-10-CM

## 2018-03-12 MED ORDER — AMOXICILLIN 500 MG PO CAPS: 500.0000 mg | ORAL_CAPSULE | Freq: Three times a day (TID) | ORAL | 0 refills | Status: DC

## 2018-03-12 NOTE — Patient Instructions (Signed)
Dental Pain Dental pain may be caused by many things, including:  Tooth decay (cavities or caries). Cavities expose the nerve of your tooth to air and hot or cold temperatures. This can cause pain or discomfort.  Abscess or infection. A dental abscess is a collection of infected pus from a bacterial infection in the inner part of the tooth (pulp). It usually occurs at the end of the tooth's root.  Injury.  An unknown reason (idiopathic).  Your pain may be mild or severe. It may only occur when:  You are chewing.  You are exposed to hot or cold temperature.  You are eating or drinking sugary foods or beverages, such as soda or candy.  Your pain may also be constant. Follow these instructions at home: Watch your dental pain for any changes. The following actions may help to lessen any discomfort that you are feeling:  Take medicines only as directed by your dentist.  If you were prescribed an antibiotic medicine, finish all of it even if you start to feel better.  Keep all follow-up visits as directed by your dentist. This is important.  Do not apply heat to the outside of your face.  Rinse your mouth or gargle with salt water if directed by your dentist. This helps with pain and swelling. ? You can make salt water by adding  tsp of salt to 1 cup of warm water.  Apply ice to the painful area of your face: ? Put ice in a plastic bag. ? Place a towel between your skin and the bag. ? Leave the ice on for 20 minutes, 2-3 times per day.  Avoid foods or drinks that cause you pain, such as: ? Very hot or very cold foods or drinks. ? Sweet or sugary foods or drinks.  Contact a health care provider if:  Your pain is not controlled with medicines.  Your symptoms are worse.  You have new symptoms. Get help right away if:  You are unable to open your mouth.  You are having trouble breathing or swallowing.  You have a fever.  Your face, neck, or jaw is swollen. This  information is not intended to replace advice given to you by your health care provider. Make sure you discuss any questions you have with your health care provider. Document Released: 09/26/2005 Document Revised: 02/04/2016 Document Reviewed: 09/22/2014 Elsevier Interactive Patient Education  2018 [employer]  

## 2018-03-12 NOTE — Progress Notes (Signed)
BP (!) 160/72 (BP Location: Left Arm, Patient Position: Sitting, Cuff Size: Large)   Pulse 91   Temp 97.9 F (36.6 C)   Wt (!) 370 lb 8 oz (168.1 kg)   SpO2 94%   BMI 58.03 kg/m    Subjective:    Patient ID: Leslie Atkinson, male    DOB: [birth year], 47 y.o.   MRN: [id number]  HPI: Leslie Atkinson is a 47 y.o. male presenting on 03/12/2018 for Abscess (lower R side. began yesterday. pt states mostly swollen and pain)   HPI  Chief Complaint  Patient presents with  . Abscess    lower R side. began yesterday. pt states mostly swollen and pain     He has dental appointment on 03/26/18  Relevant past medical, surgical, family and social history reviewed and updated as indicated. Interim medical history since our last visit reviewed. Allergies and medications reviewed and updated.   Current Outpatient Medications:  .  lisinopril (PRINIVIL,ZESTRIL) 20 MG tablet, Take 1 tablet (20 mg total) by mouth daily., Disp: 30 tablet, Rfl: 3   Review of Systems  Constitutional: Negative for appetite change, chills, diaphoresis, fatigue, fever and unexpected weight change.  HENT: Positive for dental problem, drooling, facial swelling, mouth sores and sore throat. Negative for congestion, ear pain, hearing loss, sneezing, trouble swallowing and voice change.   Eyes: Negative for pain, discharge, redness, itching and visual disturbance.  Respiratory: Negative for cough, choking, shortness of breath and wheezing.   Cardiovascular: Negative for chest pain, palpitations and leg swelling.  Gastrointestinal: Negative for abdominal pain, blood in stool, constipation, diarrhea and vomiting.  Endocrine: Negative for cold intolerance, heat intolerance and polydipsia.  Genitourinary: Negative for decreased urine volume, dysuria and hematuria.  Musculoskeletal: Negative for arthralgias, back pain and gait problem.  Skin: Negative for rash.  Allergic/Immunologic: Negative for environmental allergies.    Neurological: Negative for seizures, syncope, light-headedness and headaches.  Hematological: Negative for adenopathy.  Psychiatric/Behavioral: Negative for agitation, dysphoric mood and suicidal ideas. The patient is not nervous/anxious.     Per HPI unless specifically indicated above     Objective:    BP (!) 160/72 (BP Location: Left Arm, Patient Position: Sitting, Cuff Size: Large)   Pulse 91   Temp 97.9 F (36.6 C)   Wt (!) 370 lb 8 oz (168.1 kg)   SpO2 94%   BMI 58.03 kg/m   Wt Readings from Last 3 Encounters:  03/12/18 (!) 370 lb 8 oz (168.1 kg)  02/13/18 (!) 361 lb (163.7 kg)  11/16/17 (!) 340 lb 12 oz (154.6 kg)    Physical Exam  Constitutional: He is oriented to person, place, and time. He appears well-developed and well-nourished.  HENT:  Mouth/Throat: Uvula is midline. No trismus in the jaw. Dental caries present. No uvula swelling.    Rotten decayed tooth R lower.  Difficult to discern facial swelling due to body habitus and facial hair.  No discrete abscess seen  Neck: Neck supple.  Pulmonary/Chest: Effort normal. No respiratory distress.  Neurological: He is alert and oriented to person, place, and time.  Skin: Skin is warm and dry.  Psychiatric: He has a normal mood and affect. His behavior is normal.  Nursing note and vitals reviewed.       Assessment & Plan:   Encounter Diagnosis  Name Primary?  Paschal Dopp Yes     -rx amoxil -pt to go to dentist as scheduled -follow up here as scheduled.  RTO sooner prn

## 2018-03-27 ENCOUNTER — Encounter: Payer: Self-pay | Admitting: Physician Assistant

## 2018-03-27 ENCOUNTER — Ambulatory Visit: Payer: Self-pay | Admitting: Physician Assistant

## 2018-03-27 VITALS — BP 130/86 | HR 94 | Temp 97.3°F | Ht 67.0 in | Wt 370.2 lb

## 2018-03-27 DIAGNOSIS — I10 Essential (primary) hypertension: Secondary | ICD-10-CM

## 2018-03-27 MED ORDER — LISINOPRIL 20 MG PO TABS: 20.0000 mg | ORAL_TABLET | Freq: Every day | ORAL | 4 refills | Status: DC

## 2018-03-27 NOTE — Progress Notes (Signed)
BP 130/86 (BP Location: Left Arm, Patient Position: Sitting, Cuff Size: Large)   Pulse 94   Temp (!) 97.3 F (36.3 C)   Ht 5\' 7"  (1.702 m)   Wt (!) 370 lb 4 oz (167.9 kg)   SpO2 93%   BMI 57.99 kg/m    Subjective:    Patient ID: Leslie Atkinson, male    DOB: Dec 14, 1970, 47 y.o.   MRN: [id number]  HPI: Leslie Atkinson is a 47 y.o. male presenting on 03/27/2018 for Hypertension   HPI   Pt says dental appointment is tomorrow.  He is feeling much better today  Relevant past medical, surgical, family and social history reviewed and updated as indicated. Interim medical history since our last visit reviewed. Allergies and medications reviewed and updated.   Current Outpatient Medications:  .  Acetaminophen (TYLENOL PO), Take 1 tablet by mouth as needed., Disp: , Rfl:  .  lisinopril (PRINIVIL,ZESTRIL) 20 MG tablet, Take 1 tablet (20 mg total) by mouth daily., Disp: 30 tablet, Rfl: 3   Review of Systems  Constitutional: Positive for fatigue. Negative for appetite change, chills, diaphoresis, fever and unexpected weight change.  HENT: Positive for congestion and dental problem. Negative for drooling, ear pain, facial swelling, hearing loss, mouth sores, sneezing, sore throat, trouble swallowing and voice change.   Eyes: Negative for pain, discharge, redness, itching and visual disturbance.  Respiratory: Negative for cough, choking, shortness of breath and wheezing.   Cardiovascular: Negative for chest pain, palpitations and leg swelling.  Gastrointestinal: Negative for abdominal pain, blood in stool, constipation, diarrhea and vomiting.  Endocrine: Negative for cold intolerance, heat intolerance and polydipsia.  Genitourinary: Negative for decreased urine volume, dysuria and hematuria.  Musculoskeletal: Positive for back pain. Negative for arthralgias and gait problem.  Skin: Negative for rash.  Allergic/Immunologic: Negative for environmental allergies.  Neurological: Positive for  headaches. Negative for seizures, syncope and light-headedness.  Hematological: Negative for adenopathy.  Psychiatric/Behavioral: Negative for agitation, dysphoric mood and suicidal ideas. The patient is not nervous/anxious.     Per HPI unless specifically indicated above     Objective:    BP 130/86 (BP Location: Left Arm, Patient Position: Sitting, Cuff Size: Large)   Pulse 94   Temp (!) 97.3 F (36.3 C)   Ht 5\' 7"  (1.702 m)   Wt (!) 370 lb 4 oz (167.9 kg)   SpO2 93%   BMI 57.99 kg/m   Wt Readings from Last 3 Encounters:  03/27/18 (!) 370 lb 4 oz (167.9 kg)  03/12/18 (!) 370 lb 8 oz (168.1 kg)  02/13/18 (!) 361 lb (163.7 kg)    Physical Exam  Constitutional: He is oriented to person, place, and time. He appears well-developed and well-nourished.  HENT:  Head: Normocephalic and atraumatic.  Neck: Neck supple.  Cardiovascular: Normal rate and regular rhythm.  Pulmonary/Chest: Effort normal and breath sounds normal. He has no wheezes.  Abdominal: Soft. Bowel sounds are normal. There is no hepatosplenomegaly. There is no tenderness.  Musculoskeletal: He exhibits no edema.  Lymphadenopathy:    He has no cervical adenopathy.  Neurological: He is alert and oriented to person, place, and time.  Skin: Skin is warm and dry.  Psychiatric: He has a normal mood and affect. His behavior is normal.  Vitals reviewed.       Assessment & Plan:   Encounter Diagnosis  Name Primary?  . Essential hypertension Yes    -pt to Continue current medications -Follow up 3 months (no labs before  appointment).  RTO sooner prn

## 2018-06-27 ENCOUNTER — Ambulatory Visit: Payer: Self-pay | Admitting: Physician Assistant

## 2018-06-27 ENCOUNTER — Encounter: Payer: Self-pay | Admitting: Physician Assistant

## 2018-06-27 VITALS — BP 126/84 | HR 103 | Temp 99.7°F | Ht 67.0 in | Wt 392.2 lb

## 2018-06-27 DIAGNOSIS — I10 Essential (primary) hypertension: Secondary | ICD-10-CM

## 2018-06-27 NOTE — Progress Notes (Signed)
BP 126/84   Pulse (!) 103   Temp 99.7 F (37.6 C)   Ht 5\' 7"  (1.702 m)   Wt (!) 392 lb 4 oz (177.9 kg)   SpO2 93%   BMI 61.44 kg/m    Subjective:    Patient ID: Leslie Atkinson, male    DOB: 10/30/1970, 47 y.o.   MRN: [id number]  HPI: Leslie Atkinson is a 47 y.o. male presenting on 06/27/2018 for Hypertension   HPI Pt says he is doing well and has no complaints today.  When asked about his weight gain since last OV, he says he has just been bored so has been eating more.    Relevant past medical, surgical, family and social history reviewed and updated as indicated. Interim medical history since our last visit reviewed. Allergies and medications reviewed and updated.    Current Outpatient Medications:  .  Acetaminophen (TYLENOL PO), Take 1 tablet by mouth as needed., Disp: , Rfl:  .  lisinopril (PRINIVIL,ZESTRIL) 20 MG tablet, Take 1 tablet (20 mg total) by mouth daily., Disp: 30 tablet, Rfl: 4  Review of Systems  Constitutional: Positive for fatigue. Negative for appetite change, chills, diaphoresis, fever and unexpected weight change.  HENT: Positive for dental problem. Negative for congestion, drooling, ear pain, facial swelling, hearing loss, mouth sores, sneezing, sore throat, trouble swallowing and voice change.   Eyes: Negative for pain, discharge, redness, itching and visual disturbance.  Respiratory: Positive for shortness of breath. Negative for cough, choking and wheezing.   Cardiovascular: Negative for chest pain, palpitations and leg swelling.  Gastrointestinal: Negative for abdominal pain, blood in stool, constipation, diarrhea and vomiting.  Endocrine: Negative for cold intolerance, heat intolerance and polydipsia.  Genitourinary: Negative for decreased urine volume, dysuria and hematuria.  Musculoskeletal: Positive for back pain. Negative for arthralgias and gait problem.  Skin: Negative for rash.  Allergic/Immunologic: Negative for environmental allergies.   Neurological: Negative for seizures, syncope, light-headedness and headaches.  Hematological: Negative for adenopathy.  Psychiatric/Behavioral: Negative for agitation, dysphoric mood and suicidal ideas. The patient is not nervous/anxious.     Per HPI unless specifically indicated above     Objective:    BP 126/84   Pulse (!) 103   Temp 99.7 F (37.6 C)   Ht 5\' 7"  (1.702 m)   Wt (!) 392 lb 4 oz (177.9 kg)   SpO2 93%   BMI 61.44 kg/m   Wt Readings from Last 3 Encounters:  06/27/18 (!) 392 lb 4 oz (177.9 kg)  03/27/18 (!) 370 lb 4 oz (167.9 kg)  03/12/18 (!) 370 lb 8 oz (168.1 kg)    Physical Exam  Constitutional: He is oriented to person, place, and time. He appears well-developed and well-nourished.  HENT:  Head: Normocephalic and atraumatic.  Neck: Neck supple.  Cardiovascular: Normal rate and regular rhythm.  Pulmonary/Chest: Effort normal and breath sounds normal. He has no wheezes.  Abdominal: Soft. Bowel sounds are normal. There is no hepatosplenomegaly. There is no tenderness.  Musculoskeletal: He exhibits no edema.  Lymphadenopathy:    He has no cervical adenopathy.  Neurological: He is alert and oriented to person, place, and time.  Skin: Skin is warm and dry.  Psychiatric: He has a normal mood and affect. His behavior is normal.  Vitals reviewed.    Assessment & Plan:    Encounter Diagnoses  Name Primary?  . Essential hypertension Yes  . Morbid obesity ([place])     -Pt requests re-referral to dentist due to  moved -pt counseled on weight management and need for weight loss- discussed healthy eating habits and regular exercise -pt to continue current medication -Pt to follow up 2 months.  RTO sooner prn

## 2018-08-27 ENCOUNTER — Ambulatory Visit: Payer: Self-pay | Admitting: Physician Assistant

## 2018-08-30 ENCOUNTER — Encounter: Payer: Self-pay | Admitting: Physician Assistant

## 2018-08-30 ENCOUNTER — Other Ambulatory Visit (HOSPITAL_COMMUNITY)
Admission: RE | Admit: 2018-08-30 | Discharge: 2018-08-30 | Disposition: A | Discharge status: Home / Self Care | Payer: Self-pay | Source: Ambulatory Visit | Attending: Physician Assistant | Admitting: Physician Assistant

## 2018-08-30 ENCOUNTER — Ambulatory Visit: Payer: Self-pay | Admitting: Physician Assistant

## 2018-08-30 VITALS — BP 110/80 | HR 102 | Temp 98.1°F | Ht 67.0 in | Wt 390.8 lb

## 2018-08-30 DIAGNOSIS — I10 Essential (primary) hypertension: Secondary | ICD-10-CM | POA: Insufficient documentation

## 2018-08-30 LAB — BASIC METABOLIC PANEL
ANION GAP: 8 (ref 5–15)
BUN: 15 mg/dL (ref 6–20)
CO2: 30 mmol/L (ref 22–32)
Calcium: 8.9 mg/dL (ref 8.9–10.3)
Chloride: 102 mmol/L (ref 98–111)
Creatinine, Ser: 0.8 mg/dL (ref 0.61–1.24)
GFR calc Af Amer: >60 mL/min (ref >60)
GFR calc non Af Amer: >60 mL/min (ref >60)
GLUCOSE: 102 mg/dL — AB (ref 70–99)
Potassium: 4.5 mmol/L (ref 3.5–5.1)
Sodium: 140 mmol/L (ref 135–145)

## 2018-08-30 MED ORDER — LISINOPRIL 20 MG PO TABS: 20.0000 mg | ORAL_TABLET | Freq: Every day | ORAL | 4 refills | Status: DC

## 2018-08-30 NOTE — Progress Notes (Signed)
BP 110/80 (BP Location: Left Arm, Patient Position: Sitting, Cuff Size: Large)   Pulse (!) 102   Temp 98.1 F (36.7 C)   Ht 5\' 7"  (1.702 m)   Wt (!) 390 lb 12 oz (177.2 kg)   SpO2 93%   BMI 61.20 kg/m    Subjective:    Patient ID: Leslie Atkinson, male    DOB: November 15, 1970, 47 y.o.   MRN: [id number]  HPI: Leslie Atkinson is a 47 y.o. male presenting on 08/30/2018 for Hypertension   HPI   Pt's mother hospice with brain and lung cancer  Pt is doing well aside from the stress of his mother's imminent death  Relevant past medical, surgical, family and social history reviewed and updated as indicated. Interim medical history since our last visit reviewed. Allergies and medications reviewed and updated.   Current Outpatient Medications:  .  Acetaminophen (TYLENOL PO), Take 1 tablet by mouth as needed., Disp: , Rfl:  .  lisinopril (PRINIVIL,ZESTRIL) 20 MG tablet, Take 1 tablet (20 mg total) by mouth daily., Disp: 30 tablet, Rfl: 4   Review of Systems  Constitutional: Positive for fatigue. Negative for appetite change, chills, diaphoresis, fever and unexpected weight change.  HENT: Positive for dental problem and drooling. Negative for congestion, ear pain, facial swelling, hearing loss, mouth sores, sneezing, sore throat, trouble swallowing and voice change.   Eyes: Negative for pain, discharge, redness, itching and visual disturbance.  Respiratory: Negative for cough, choking, shortness of breath and wheezing.   Cardiovascular: Negative for chest pain, palpitations and leg swelling.  Gastrointestinal: Negative for abdominal pain, blood in stool, constipation, diarrhea and vomiting.  Endocrine: Negative for cold intolerance, heat intolerance and polydipsia.  Genitourinary: Negative for decreased urine volume, dysuria and hematuria.  Musculoskeletal: Positive for back pain. Negative for arthralgias and gait problem.  Skin: Negative for rash.  Allergic/Immunologic: Negative for  environmental allergies.  Neurological: Negative for seizures, syncope, light-headedness and headaches.  Hematological: Negative for adenopathy.  Psychiatric/Behavioral: Negative for agitation, dysphoric mood and suicidal ideas. The patient is not nervous/anxious.     Per HPI unless specifically indicated above     Objective:    BP 110/80 (BP Location: Left Arm, Patient Position: Sitting, Cuff Size: Large)   Pulse (!) 102   Temp 98.1 F (36.7 C)   Ht 5\' 7"  (1.702 m)   Wt (!) 390 lb 12 oz (177.2 kg)   SpO2 93%   BMI 61.20 kg/m   Wt Readings from Last 3 Encounters:  08/30/18 (!) 390 lb 12 oz (177.2 kg)  06/27/18 (!) 392 lb 4 oz (177.9 kg)  03/27/18 (!) 370 lb 4 oz (167.9 kg)    Physical Exam  Constitutional: He is oriented to person, place, and time. He appears well-developed and well-nourished.  HENT:  Head: Normocephalic and atraumatic.  Neck: Neck supple.  Cardiovascular: Normal rate and regular rhythm.  Pulmonary/Chest: Effort normal and breath sounds normal. He has no wheezes.  Abdominal: Soft. Bowel sounds are normal. There is no hepatosplenomegaly. There is no tenderness.  Musculoskeletal: He exhibits no edema.  Lymphadenopathy:    He has no cervical adenopathy.  Neurological: He is alert and oriented to person, place, and time.  Skin: Skin is warm and dry.  Psychiatric: He has a normal mood and affect. His behavior is normal.  Vitals reviewed.         Assessment & Plan:   Encounter Diagnoses  Name Primary?  . Essential hypertension Yes  . Morbid obesity (  [place])      -pt to Get labs drawn.  Will call him with results -pt to continue current medications -pt to follow up 4 months.  RTO sooner prn

## 2018-11-19 ENCOUNTER — Emergency Department (HOSPITAL_COMMUNITY): Payer: Self-pay

## 2018-11-19 ENCOUNTER — Encounter (HOSPITAL_COMMUNITY): Payer: Self-pay | Admitting: Emergency Medicine

## 2018-11-19 ENCOUNTER — Inpatient Hospital Stay (HOSPITAL_COMMUNITY)
Admission: EM | Admit: 2018-11-19 | Discharge: 2018-11-24 | DRG: 292 | Disposition: A | Discharge status: Home / Self Care | Payer: Self-pay | Attending: Internal Medicine | Admitting: Internal Medicine

## 2018-11-19 DIAGNOSIS — I5033 Acute on chronic diastolic (congestive) heart failure: Secondary | ICD-10-CM

## 2018-11-19 DIAGNOSIS — Z9111 Patient's noncompliance with dietary regimen: Secondary | ICD-10-CM

## 2018-11-19 DIAGNOSIS — Z8249 Family history of ischemic heart disease and other diseases of the circulatory system: Secondary | ICD-10-CM

## 2018-11-19 DIAGNOSIS — N5089 Other specified disorders of the male genital organs: Secondary | ICD-10-CM | POA: Diagnosis present

## 2018-11-19 DIAGNOSIS — F17220 Nicotine dependence, chewing tobacco, uncomplicated: Secondary | ICD-10-CM | POA: Diagnosis present

## 2018-11-19 DIAGNOSIS — Z79899 Other long term (current) drug therapy: Secondary | ICD-10-CM

## 2018-11-19 DIAGNOSIS — I10 Essential (primary) hypertension: Secondary | ICD-10-CM | POA: Diagnosis present

## 2018-11-19 DIAGNOSIS — G4733 Obstructive sleep apnea (adult) (pediatric): Secondary | ICD-10-CM | POA: Diagnosis present

## 2018-11-19 DIAGNOSIS — I110 Hypertensive heart disease with heart failure: Principal | ICD-10-CM | POA: Diagnosis present

## 2018-11-19 DIAGNOSIS — Z6841 Body Mass Index (BMI) 40.0 and over, adult: Secondary | ICD-10-CM

## 2018-11-19 DIAGNOSIS — R601 Generalized edema: Secondary | ICD-10-CM | POA: Diagnosis present

## 2018-11-19 LAB — BASIC METABOLIC PANEL
Anion gap: 7 (ref 5–15)
BUN: 15 mg/dL (ref 6–20)
CALCIUM: 8.5 mg/dL — AB (ref 8.9–10.3)
CO2: 35 mmol/L — ABNORMAL HIGH (ref 22–32)
Chloride: 100 mmol/L (ref 98–111)
Creatinine, Ser: 1.12 mg/dL (ref 0.61–1.24)
GFR calc Af Amer: >60 mL/min (ref >60)
GFR calc non Af Amer: >60 mL/min (ref >60)
Glucose, Bld: 115 mg/dL — ABNORMAL HIGH (ref 70–99)
Potassium: 4.7 mmol/L (ref 3.5–5.1)
Sodium: 142 mmol/L (ref 135–145)

## 2018-11-19 LAB — HEPATIC FUNCTION PANEL
ALK PHOS: 55 U/L (ref 38–126)
ALT: 9 U/L (ref 0–44)
AST: 15 U/L (ref 15–41)
Albumin: 2.7 g/dL — ABNORMAL LOW (ref 3.5–5.0)
Bilirubin, Direct: 0.1 mg/dL (ref 0.0–0.2)
Indirect Bilirubin: 0.3 mg/dL (ref 0.3–0.9)
Total Bilirubin: 0.4 mg/dL (ref 0.3–1.2)
Total Protein: 5.9 g/dL — ABNORMAL LOW (ref 6.5–8.1)

## 2018-11-19 LAB — I-STAT TROPONIN, ED: Troponin i, poc: 0.02 ng/mL (ref 0.00–0.08)

## 2018-11-19 LAB — CBC
HCT: 51.8 % (ref 39.0–52.0)
Hemoglobin: 15.2 g/dL (ref 13.0–17.0)
MCH: 30.3 pg (ref 26.0–34.0)
MCHC: 29.3 g/dL — AB (ref 30.0–36.0)
MCV: 103.4 fL — ABNORMAL HIGH (ref 80.0–100.0)
Platelets: 207 10*3/uL (ref 150–400)
RBC: 5.01 MIL/uL (ref 4.22–5.81)
RDW: 14.4 % (ref 11.5–15.5)
WBC: 9.6 10*3/uL (ref 4.0–10.5)
nRBC: 0 % (ref 0.0–0.2)

## 2018-11-19 LAB — BRAIN NATRIURETIC PEPTIDE: B Natriuretic Peptide: 183.3 pg/mL — ABNORMAL HIGH (ref 0.0–100.0)

## 2018-11-19 MED ORDER — FUROSEMIDE 10 MG/ML IJ SOLN
60.0000 mg | Freq: Once | INTRAMUSCULAR | Status: AC
  Administered 2018-11-20: 60 mg via INTRAVENOUS
  Filled 2018-11-19: qty 6

## 2018-11-19 MED ORDER — SODIUM CHLORIDE 0.9% FLUSH: 3.0000 mL | Freq: Once | INTRAVENOUS | Status: DC

## 2018-11-19 NOTE — ED Provider Notes (Signed)
Emergency Department Provider Note   I have reviewed the triage vital signs and the nursing notes.   HISTORY  Chief Complaint Shortness of Breath   HPI Leslie Atkinson is a 48 y.o. male with PMH of HTN and obesity presents to the emergency department for evaluation of continued abdominal and lower extremity swelling with severe swelling of the scrotum and penis.  Patient has had significant swelling worsening over the past 1.5 weeks.  He was evaluated at an emergency department and [place], [place].  Patient was told at that time he likely had some congestive heart failure and was given 5 days of 20 mg Lasix which she has taken.  Patient has not noticed significant increase in his urine output and fluid only continues to accumulate.  Patient states he has to urinate into a bathtub or shower because of the severe swelling in his groin and abdomen.  No fevers or chills.  No known liver disease.  Patient has had constipation but continues to have bowel movements and pass flatus.  No vomiting.   Past Medical History:  Diagnosis Date  . Decreased vision    right eye  . Facial bones, closed fracture [facility]) 2008   left tripod fracture    Patient Active Problem List   Diagnosis Date Noted  . Essential hypertension 11/20/2018  . Anasarca 11/19/2018  . OSA (obstructive sleep apnea): Probable 12/14/2016  . Hyperglycemia   . Cellulitis and abscess of right leg 12/12/2016  . Abscess of leg, right 12/12/2016  . Cellulitis of right anterior lower leg 12/11/2016  . Thrombocytosis ([place]) 12/11/2016  . Tobacco abuse 12/11/2016  . Cellulitis 12/10/2016  . Elevated blood pressure reading 12/10/2016  . Elevated blood sugar 12/10/2016    Past Surgical History:  Procedure Laterality Date  . I&D EXTREMITY Right 12/14/2016   Procedure: IRRIGATION AND DEBRIDEMENT EXTREMITY;  Surgeon: Leandrew Koyanagi, MD;  Location: [place];  Service: Orthopedics;  Laterality: Right;   Allergies Yellow jacket venom [bee venom]  and Sulfa antibiotics  Family History  Problem Relation Age of Onset  . Diabetes Mother   . Heart disease Father     Social History Social History   Tobacco Use  . Smoking status: Never Smoker  . Smokeless tobacco: Current User    Types: Chew  Substance Use Topics  . Alcohol use: No  . Drug use: Yes    Types: Marijuana    Review of Systems  Constitutional: No fever/chills Eyes: No visual changes. ENT: No sore throat. Cardiovascular: Denies chest pain. Respiratory: Denies shortness of breath. Gastrointestinal: Positive abdominal pain.  No nausea, no vomiting.  No diarrhea. Positive constipation. Genitourinary: Negative for dysuria. Positive severe scrotal and penile edema.  Musculoskeletal: Negative for back pain. Skin: Negative for rash. Neurological: Negative for headaches, focal weakness or numbness.  10-point ROS otherwise negative.  ____________________________________________   PHYSICAL EXAM:  VITAL SIGNS: ED Triage Vitals  Enc Vitals Group     BP 11/19/18 2116 (!) 151/86     Pulse Rate 11/19/18 2116 99     Resp 11/19/18 2116 (!) 22     Temp 11/19/18 2116 98.2 F (36.8 C)     Temp Source 11/19/18 2116 Oral     SpO2 11/19/18 2116 94 %     Pain Score 11/19/18 2114 0   Constitutional: Alert and oriented. Well appearing and in no acute distress. Eyes: Conjunctivae are normal. PERRL. EOMI. Head: Atraumatic. Nose: No congestion/rhinnorhea. Mouth/Throat: Mucous membranes are moist.  Oropharynx  non-erythematous. Neck: No stridor.   Cardiovascular: Normal rate, regular rhythm. Good peripheral circulation. Grossly normal heart sounds.   Respiratory: Normal respiratory effort.  No retractions. Lungs CTAB. Gastrointestinal: Soft and distended with pitting edema in the lower abdomen. Reducible umbilical hernia noted. Non-tender to palpation.  Genitourinary: Exam performed with chaperone. Diffusely edematous scrotum and penis. No obvious unilateral swelling. No  skin breakdown.  Musculoskeletal: 3+ pitting edema in the B/L LE but worse on the right.  Neurologic:  Normal speech and language. No gross focal neurologic deficits are appreciated.  Skin:  Skin is warm, dry and intact. No rash noted.  ____________________________________________   LABS (all labs ordered are listed, but only abnormal results are displayed)  Labs Reviewed  BASIC METABOLIC PANEL - Abnormal; Notable for the following components:      Result Value   CO2 35 (*)    Glucose, Bld 115 (*)    Calcium 8.5 (*)    All other components within normal limits  CBC - Abnormal; Notable for the following components:   MCV 103.4 (*)    MCHC 29.3 (*)    All other components within normal limits  BRAIN NATRIURETIC PEPTIDE - Abnormal; Notable for the following components:   B Natriuretic Peptide 183.3 (*)    All other components within normal limits  HEPATIC FUNCTION PANEL - Abnormal; Notable for the following components:   Total Protein 5.9 (*)    Albumin 2.7 (*)    All other components within normal limits  BASIC METABOLIC PANEL - Abnormal; Notable for the following components:   Chloride 97 (*)    Glucose, Bld 176 (*)    Calcium 8.2 (*)    All other components within normal limits  CBC - Abnormal; Notable for the following components:   MCV 103.4 (*)    MCHC 29.3 (*)    nRBC 0.5 (*)    All other components within normal limits  URINALYSIS, ROUTINE W REFLEX MICROSCOPIC - Abnormal; Notable for the following components:   Color, Urine COLORLESS (*)    All other components within normal limits  MAGNESIUM  TSH  HIV ANTIBODY (ROUTINE TESTING W REFLEX)  I-STAT TROPONIN, ED   ____________________________________________  EKG   EKG Interpretation  Date/Time:  Monday November 19 2018 21:10:58 EST Ventricular Rate:  101 PR Interval:  136 QRS Duration: 104 QT Interval:  340 QTC Calculation: 440 R Axis:   105 Text Interpretation:  Sinus tachycardia Rightward axis Borderline  ECG No STEMI  Confirmed by Nanda Quinton [phone]) on 11/19/2018 10:17:10 PM Also confirmed by Nanda Quinton [phone]), editor Philomena Doheny [phone])  on 11/20/2018 7:57:25 AM      ____________________________________________  RADIOLOGY  Ct Abdomen Pelvis Wo Contrast  Result Date: 11/19/2018 CLINICAL DATA:  Groin swelling for several days, no known injury, initial encounter EXAM: CT ABDOMEN AND PELVIS WITHOUT CONTRAST TECHNIQUE: Multidetector CT imaging of the abdomen and pelvis was performed following the standard protocol without IV contrast. COMPARISON:  None. FINDINGS: Lower chest: No acute abnormality. Hepatobiliary: Liver is within normal limits. The gallbladder is well distended. Gallstone is noted within the neck of the gallbladder. No significant wall thickening is noted. Pancreas: Unremarkable. No pancreatic ductal dilatation or surrounding inflammatory changes. Spleen: Normal in size without focal abnormality. Adrenals/Urinary Tract: Adrenal glands are within normal limits. Kidneys are well visualized bilaterally. No renal calculi or urinary tract obstructive changes are noted. Bladder is well distended. Stomach/Bowel: Mild diverticular change of the colon is noted. No evidence of diverticulitis  is seen. The appendix is within normal limits. No small bowel abnormality is noted. The stomach is decompressed. Vascular/Lymphatic: Retroaortic left renal vein is noted. No aortic calcifications are seen. No significant lymphadenopathy is noted. Reproductive: Prostate is unremarkable. Other: No ascites is noted. Fat containing umbilical hernia is noted. No evidence of incarceration is seen Musculoskeletal: Degenerative changes of lumbar spine are noted. No acute bony abnormality is noted. Mild edematous changes are noted in the anterior abdominal wall IMPRESSION: Cholelithiasis without complicating factors. Diverticulosis without diverticulitis. Fat containing umbilical hernia. Mild subcutaneous edema in the  anterior abdominal wall. Electronically Signed   By: Inez Catalina M.D.   On: 11/19/2018 23:45   Dg Chest 2 View  Result Date: 11/19/2018 CLINICAL DATA:  48 y/o M; shortness of breath and increased heart rate. EXAM: CHEST - 2 VIEW COMPARISON:  11/16/2017 chest radiograph FINDINGS: Stable mildly enlarged cardiac silhouette given projection and technique. Basilar predominant reticular opacities of the lungs. Pulmonary venous hypertension. No consolidation, effusion, or pneumothorax. IMPRESSION: Pulmonary vascular congestion. Stable enlarged cardiac silhouette given projection and technique. Electronically Signed   By: Kristine Garbe M.D.   On: 11/19/2018 23:45    ____________________________________________   PROCEDURES  Procedure(s) performed:   Procedures  None ____________________________________________   INITIAL IMPRESSION / ASSESSMENT AND PLAN / ED COURSE  Pertinent labs & imaging results that were available during my care of the patient were reviewed by me and considered in my medical decision making (see chart for details).  Patient with pitting edema to the mid-abdomen. Diffuse edema of the scrotum and LE. Reducible umbilical hernia. No SBO or incarceration symptoms. CXR with enlarged cardiac silhouette with pulmonary edema. BNP slightly elevated. Plan for IV lasix and admit fir diuresis and ECHO.   Discussed patient's case with Hospitalist to request admission. Patient and family (if present) updated with plan. Care transferred to Hospitalist service.  I reviewed all nursing notes, vitals, pertinent old records, EKGs, labs, imaging (as available).    ____________________________________________  FINAL CLINICAL IMPRESSION(S) / ED DIAGNOSES  Final diagnoses:  Anasarca     MEDICATIONS GIVEN DURING THIS VISIT:  Medications  sodium chloride flush (NS) 0.9 % injection 3 mL (0 mLs Intravenous Hold 11/19/18 2159)  lisinopril (PRINIVIL,ZESTRIL) tablet 20 mg (has no  administration in time range)  multivitamin with minerals tablet 1 tablet (has no administration in time range)  acetaminophen (TYLENOL) tablet 650 mg (has no administration in time range)    Or  acetaminophen (TYLENOL) suppository 650 mg (has no administration in time range)  ondansetron (ZOFRAN) tablet 4 mg (has no administration in time range)    Or  ondansetron (ZOFRAN) injection 4 mg (has no administration in time range)  enoxaparin (LOVENOX) injection 80 mg (has no administration in time range)  furosemide (LASIX) injection 60 mg (60 mg Intravenous Given 11/20/18 0600)  furosemide (LASIX) injection 60 mg (60 mg Intravenous Given 11/20/18 0114)     Note:  This document was prepared using Dragon voice recognition software and may include unintentional dictation errors.  Nanda Quinton, MD Emergency Medicine    Staphany Ditton, [facility], MD 11/20/18 [phone]

## 2018-11-19 NOTE — ED Notes (Signed)
Pt to CT at this time.

## 2018-11-19 NOTE — ED Triage Notes (Signed)
Patient with shortness of breath with exertion.  His heart rate increases with exertion.  He is having swelling in his groin.  He does not have a primary care physician.  Patient states that he was seen in the ED in [place] and was discharged.  Patient was diagnosed with CHF.  They did not admit him, he is worse than he was before.

## 2018-11-20 ENCOUNTER — Inpatient Hospital Stay (HOSPITAL_COMMUNITY): Payer: Self-pay

## 2018-11-20 ENCOUNTER — Other Ambulatory Visit: Payer: Self-pay

## 2018-11-20 ENCOUNTER — Encounter (HOSPITAL_COMMUNITY): Payer: Self-pay | Admitting: Internal Medicine

## 2018-11-20 DIAGNOSIS — I10 Essential (primary) hypertension: Secondary | ICD-10-CM | POA: Diagnosis present

## 2018-11-20 DIAGNOSIS — I509 Heart failure, unspecified: Secondary | ICD-10-CM

## 2018-11-20 LAB — ECHOCARDIOGRAM COMPLETE
Height: 67 in
WEIGHTICAEL: 6766.4 [oz_av]

## 2018-11-20 LAB — CBC
HCT: 51.2 % (ref 39.0–52.0)
Hemoglobin: 15 g/dL (ref 13.0–17.0)
MCH: 30.3 pg (ref 26.0–34.0)
MCHC: 29.3 g/dL — ABNORMAL LOW (ref 30.0–36.0)
MCV: 103.4 fL — ABNORMAL HIGH (ref 80.0–100.0)
Platelets: 221 10*3/uL (ref 150–400)
RBC: 4.95 MIL/uL (ref 4.22–5.81)
RDW: 14.5 % (ref 11.5–15.5)
WBC: 9.3 10*3/uL (ref 4.0–10.5)
nRBC: 0.5 % — ABNORMAL HIGH (ref 0.0–0.2)

## 2018-11-20 LAB — URINALYSIS, ROUTINE W REFLEX MICROSCOPIC
Bilirubin Urine: NEGATIVE
Glucose, UA: NEGATIVE mg/dL
Hgb urine dipstick: NEGATIVE
Ketones, ur: NEGATIVE mg/dL
Leukocytes, UA: NEGATIVE
Nitrite: NEGATIVE
Protein, ur: NEGATIVE mg/dL
Specific Gravity, Urine: 1.005 (ref 1.005–1.030)
pH: 5 (ref 5.0–8.0)

## 2018-11-20 LAB — BASIC METABOLIC PANEL
Anion gap: 12 (ref 5–15)
BUN: 15 mg/dL (ref 6–20)
CO2: 31 mmol/L (ref 22–32)
Calcium: 8.2 mg/dL — ABNORMAL LOW (ref 8.9–10.3)
Chloride: 97 mmol/L — ABNORMAL LOW (ref 98–111)
Creatinine, Ser: 1.05 mg/dL (ref 0.61–1.24)
GFR calc Af Amer: >60 mL/min (ref >60)
GFR calc non Af Amer: >60 mL/min (ref >60)
Glucose, Bld: 176 mg/dL — ABNORMAL HIGH (ref 70–99)
Potassium: 4.2 mmol/L (ref 3.5–5.1)
Sodium: 140 mmol/L (ref 135–145)

## 2018-11-20 LAB — HIV ANTIBODY (ROUTINE TESTING W REFLEX): HIV Screen 4th Generation wRfx: NONREACTIVE

## 2018-11-20 LAB — TSH: TSH: 3.122 u[IU]/mL (ref 0.350–4.500)

## 2018-11-20 LAB — MAGNESIUM: Magnesium: 1.7 mg/dL (ref 1.7–2.4)

## 2018-11-20 MED ORDER — ACETAMINOPHEN 650 MG RE SUPP: 650.0000 mg | Freq: Four times a day (QID) | RECTAL | Status: DC | PRN

## 2018-11-20 MED ORDER — IPRATROPIUM-ALBUTEROL 0.5-2.5 (3) MG/3ML IN SOLN
3.0000 mL | Freq: Once | RESPIRATORY_TRACT | Status: DC
  Filled 2018-11-20: qty 3

## 2018-11-20 MED ORDER — ADULT MULTIVITAMIN W/MINERALS CH
1.0000 | ORAL_TABLET | ORAL | Status: DC
  Administered 2018-11-22: 1 via ORAL
  Filled 2018-11-20: qty 1

## 2018-11-20 MED ORDER — LISINOPRIL 20 MG PO TABS
20.0000 mg | ORAL_TABLET | Freq: Every day | ORAL | Status: DC
  Administered 2018-11-20 – 2018-11-24 (×5): 20 mg via ORAL
  Filled 2018-11-20 (×5): qty 1

## 2018-11-20 MED ORDER — PERFLUTREN LIPID MICROSPHERE
1.0000 mL | INTRAVENOUS | Status: AC | PRN
  Administered 2018-11-20: 10 mL via INTRAVENOUS
  Filled 2018-11-20: qty 10

## 2018-11-20 MED ORDER — ENOXAPARIN SODIUM 80 MG/0.8ML ~~LOC~~ SOLN
80.0000 mg | SUBCUTANEOUS | Status: DC
  Administered 2018-11-20 – 2018-11-21 (×2): 80 mg via SUBCUTANEOUS
  Filled 2018-11-20 (×4): qty 0.8

## 2018-11-20 MED ORDER — FUROSEMIDE 10 MG/ML IJ SOLN
60.0000 mg | Freq: Two times a day (BID) | INTRAMUSCULAR | Status: DC
  Administered 2018-11-20 – 2018-11-24 (×9): 60 mg via INTRAVENOUS
  Filled 2018-11-20 (×9): qty 6

## 2018-11-20 MED ORDER — ACETAMINOPHEN 325 MG PO TABS: 650.0000 mg | ORAL_TABLET | Freq: Four times a day (QID) | ORAL | Status: DC | PRN

## 2018-11-20 MED ORDER — ONDANSETRON HCL 4 MG/2ML IJ SOLN
4.0000 mg | Freq: Four times a day (QID) | INTRAMUSCULAR | Status: DC | PRN
  Administered 2018-11-24: 4 mg via INTRAVENOUS
  Filled 2018-11-20: qty 2

## 2018-11-20 MED ORDER — ONDANSETRON HCL 4 MG PO TABS: 4.0000 mg | ORAL_TABLET | Freq: Four times a day (QID) | ORAL | Status: DC | PRN

## 2018-11-20 NOTE — ED Notes (Signed)
Heart Healthy Diet was ordered for Lunch. 

## 2018-11-20 NOTE — H&P (Signed)
History and Physical    Leslie Atkinson ALP:[id number] DOB: 12/23/1970 DOA: 11/19/2018  PCP: Soyla Dryer, PA-C  Patient coming from: Home.  Chief Complaint: Increasing edema.  HPI: Leslie Atkinson is a 48 y.o. male with history of hypertension presents to the ER because of worsening peripheral edema.  Patient states he has been having increasing edema over the last 1 week had gone to hospital in [place] and was prescribed 20 mg of Lasix daily for last 5 days.  Despite taking which patient swelling has been worsening including both lower extremities scrotum abdomen.  Has been getting exertional shortness of breath and orthopnea.  Denies any chest pain productive cough fever or chills.  ED Course: In the ER on exam patient has significant swelling of both lower extremities abdomen and scrotum.  Labs revealed BNP of 183 troponin 0.02 albumin was 2.7.  EKG was showing normal sinus rhythm.  Chest x-ray shows cardiomegaly.  CT abdomen was done which shows likely hernia cholelithiasis and abdominal wall edema.  Patient was given 60 mg IV Lasix and admitted for anasarca.  Review of Systems: As per HPI, rest all negative.   Past Medical History:  Diagnosis Date  . Decreased vision    right eye  . Facial bones, closed fracture [facility]) 2008   left tripod fracture    Past Surgical History:  Procedure Laterality Date  . I&D EXTREMITY Right 12/14/2016   Procedure: IRRIGATION AND DEBRIDEMENT EXTREMITY;  Surgeon: Leandrew Koyanagi, MD;  Location: [place];  Service: Orthopedics;  Laterality: Right;     reports that he has never smoked. His smokeless tobacco use includes chew. He reports current drug use. Drug: Marijuana. He reports that he does not drink alcohol.  Allergies  Allergen Reactions  . Yellow Jacket Venom [Bee Venom] Swelling    Facial swelling (severe)  . Sulfa Antibiotics Hives    Family History  Problem Relation Age of Onset  . Diabetes Mother   . Heart disease Father     Prior to  Admission medications   Medication Sig Start Date End Date Taking? Authorizing Provider  ibuprofen (ADVIL,MOTRIN) 200 MG tablet Take 400 mg by mouth every 8 (eight) hours as needed (for back pain).   Yes [provider]  lisinopril (PRINIVIL,ZESTRIL) 20 MG tablet Take 1 tablet (20 mg total) by mouth daily. 08/30/18  Yes Soyla Dryer, PA-C  Multiple Vitamins-Minerals (MULTIVITAMIN MEN) TABS Take 1 tablet by mouth 2 (two) times a week.   Yes [provider]    Physical Exam: Vitals:   11/19/18 2116 11/20/18 0124  BP: (!) 151/86 108/66  Pulse: 99 (!) 101  Resp: (!) 22 20  Temp: 98.2 F (36.8 C)   TempSrc: Oral   SpO2: 94% 91%      Constitutional: Moderately built and nourished. Vitals:   11/19/18 2116 11/20/18 0124  BP: (!) 151/86 108/66  Pulse: 99 (!) 101  Resp: (!) 22 20  Temp: 98.2 F (36.8 C)   TempSrc: Oral   SpO2: 94% 91%   Eyes: Anicteric no pallor. ENMT: No discharge from the ears eyes nose and mouth. Neck: No mass felt.  No neck rigidity.  No JVD appreciated. Respiratory: No rhonchi or crepitations. Cardiovascular: S1-S2 heard. Abdomen: Distended nontender bowel sounds present.  Hernia present. Musculoskeletal: Bilateral lower extremity edema present. Skin: No skin changes. Neurologic: Alert awake oriented to time place and person.  Moves all extremities. Psychiatric: Appears normal.  Normal affect.   Labs on Admission: I have personally  reviewed following labs and imaging studies  CBC: Recent Labs  Lab 11/19/18 2131  WBC 9.6  HGB 15.2  HCT 51.8  MCV 103.4*  PLT 629   Basic Metabolic Panel: Recent Labs  Lab 11/19/18 2131  NA 142  K 4.7  CL 100  CO2 35*  GLUCOSE 115*  BUN 15  CREATININE 1.12  CALCIUM 8.5*   GFR: CrCl cannot be calculated (Unknown ideal weight.). Liver Function Tests: Recent Labs  Lab 11/19/18 2131  AST 15  ALT 9  ALKPHOS 55  BILITOT 0.4  PROT 5.9*  ALBUMIN 2.7*   No results for input(s):  LIPASE, AMYLASE in the last 168 hours. No results for input(s): AMMONIA in the last 168 hours. Coagulation Profile: No results for input(s): INR, PROTIME in the last 168 hours. Cardiac Enzymes: No results for input(s): CKTOTAL, CKMB, CKMBINDEX, TROPONINI in the last 168 hours. BNP (last 3 results) No results for input(s): PROBNP in the last 8760 hours. HbA1C: No results for input(s): HGBA1C in the last 72 hours. CBG: No results for input(s): GLUCAP in the last 168 hours. Lipid Profile: No results for input(s): CHOL, HDL, LDLCALC, TRIG, CHOLHDL, LDLDIRECT in the last 72 hours. Thyroid Function Tests: No results for input(s): TSH, T4TOTAL, FREET4, T3FREE, THYROIDAB in the last 72 hours. Anemia Panel: No results for input(s): VITAMINB12, FOLATE, FERRITIN, TIBC, IRON, RETICCTPCT in the last 72 hours. Urine analysis: No results found for: COLORURINE, APPEARANCEUR, LABSPEC, PHURINE, GLUCOSEU, HGBUR, BILIRUBINUR, KETONESUR, PROTEINUR, UROBILINOGEN, NITRITE, LEUKOCYTESUR Sepsis Labs: @LABRCNTIP (procalcitonin:4,lacticidven:4) )No results found for this or any previous visit (from the past 240 hour(s)).   Radiological Exams on Admission: Ct Abdomen Pelvis Wo Contrast  Result Date: 11/19/2018 CLINICAL DATA:  Groin swelling for several days, no known injury, initial encounter EXAM: CT ABDOMEN AND PELVIS WITHOUT CONTRAST TECHNIQUE: Multidetector CT imaging of the abdomen and pelvis was performed following the standard protocol without IV contrast. COMPARISON:  None. FINDINGS: Lower chest: No acute abnormality. Hepatobiliary: Liver is within normal limits. The gallbladder is well distended. Gallstone is noted within the neck of the gallbladder. No significant wall thickening is noted. Pancreas: Unremarkable. No pancreatic ductal dilatation or surrounding inflammatory changes. Spleen: Normal in size without focal abnormality. Adrenals/Urinary Tract: Adrenal glands are within normal limits. Kidneys are  well visualized bilaterally. No renal calculi or urinary tract obstructive changes are noted. Bladder is well distended. Stomach/Bowel: Mild diverticular change of the colon is noted. No evidence of diverticulitis is seen. The appendix is within normal limits. No small bowel abnormality is noted. The stomach is decompressed. Vascular/Lymphatic: Retroaortic left renal vein is noted. No aortic calcifications are seen. No significant lymphadenopathy is noted. Reproductive: Prostate is unremarkable. Other: No ascites is noted. Fat containing umbilical hernia is noted. No evidence of incarceration is seen Musculoskeletal: Degenerative changes of lumbar spine are noted. No acute bony abnormality is noted. Mild edematous changes are noted in the anterior abdominal wall IMPRESSION: Cholelithiasis without complicating factors. Diverticulosis without diverticulitis. Fat containing umbilical hernia. Mild subcutaneous edema in the anterior abdominal wall. Electronically Signed   By: Inez Catalina M.D.   On: 11/19/2018 23:45   Dg Chest 2 View  Result Date: 11/19/2018 CLINICAL DATA:  48 y/o M; shortness of breath and increased heart rate. EXAM: CHEST - 2 VIEW COMPARISON:  11/16/2017 chest radiograph FINDINGS: Stable mildly enlarged cardiac silhouette given projection and technique. Basilar predominant reticular opacities of the lungs. Pulmonary venous hypertension. No consolidation, effusion, or pneumothorax. IMPRESSION: Pulmonary vascular congestion. Stable enlarged cardiac  silhouette given projection and technique. Electronically Signed   By: Kristine Garbe M.D.   On: 11/19/2018 23:45    EKG: Independently reviewed.  Normal sinus rhythm.  Assessment/Plan Principal Problem:   Anasarca Active Problems:   OSA (obstructive sleep apnea): Probable   Essential hypertension    1. Anasarca -UA is pending.  Patient does have some hypoalbuminemia chest x-ray shows cardiomegaly.  Will get 2D echo continue with  diuretics 60 mg IV every 12 for now.  Based on the UA results and 2D echo will have further plans. 2. Hypertension on lisinopril. 3. Cholelithiasis seen in the CAT scan with no symptoms at this time. 4. Umbilical hernia does not appear to be obstructed.   DVT prophylaxis: Lovenox. Code Status: Full code. Family Communication: Discussed with patient. Disposition Plan: Home. Consults called: None. Admission status: Inpatient.   Rise Patience MD [facility] Pager [phone].  If 7PM-7AM, please contact night-coverage [email or web address] Password [facility]  11/20/2018, 2:35 AM

## 2018-11-20 NOTE — ED Notes (Signed)
Ordered Hospital Bed 

## 2018-11-20 NOTE — Progress Notes (Signed)
*  PRELIMINARY RESULTS* Echocardiogram 2D Echocardiogram has been performed with Definity given by the R.N..  Samuel Germany 11/20/2018, 4:29 PM

## 2018-11-20 NOTE — ED Notes (Signed)
Breakfast Tray Ordered. 

## 2018-11-20 NOTE — Progress Notes (Signed)
Patient is admitted this am for fluids overload, he is feeling better on iv lasix, good urine output, echo is done, result pending, will add on venous doppler to r/o DVT in legs.

## 2018-11-20 NOTE — ED Notes (Signed)
Pt placed on 3 LPM 02 via nasal cannula

## 2018-11-21 ENCOUNTER — Inpatient Hospital Stay (HOSPITAL_COMMUNITY): Payer: Self-pay

## 2018-11-21 DIAGNOSIS — R609 Edema, unspecified: Secondary | ICD-10-CM

## 2018-11-21 LAB — BASIC METABOLIC PANEL
ANION GAP: 13 (ref 5–15)
BUN: 16 mg/dL (ref 6–20)
CO2: 37 mmol/L — ABNORMAL HIGH (ref 22–32)
Calcium: 8.7 mg/dL — ABNORMAL LOW (ref 8.9–10.3)
Chloride: 90 mmol/L — ABNORMAL LOW (ref 98–111)
Creatinine, Ser: 1.01 mg/dL (ref 0.61–1.24)
GFR calc Af Amer: >60 mL/min (ref >60)
GFR calc non Af Amer: >60 mL/min (ref >60)
Glucose, Bld: 135 mg/dL — ABNORMAL HIGH (ref 70–99)
POTASSIUM: 4.1 mmol/L (ref 3.5–5.1)
Sodium: 140 mmol/L (ref 135–145)

## 2018-11-21 LAB — LIPID PANEL
Cholesterol: 139 mg/dL (ref 0–200)
HDL: 40 mg/dL — ABNORMAL LOW (ref >40)
LDL Cholesterol: 61 mg/dL (ref 0–99)
Total CHOL/HDL Ratio: 3.5 RATIO
Triglycerides: 190 mg/dL — ABNORMAL HIGH (ref <150)
VLDL: 38 mg/dL (ref 0–40)

## 2018-11-21 LAB — MAGNESIUM: Magnesium: 2 mg/dL (ref 1.7–2.4)

## 2018-11-21 LAB — HEMOGLOBIN A1C
HEMOGLOBIN A1C: 6.3 % — AB (ref 4.8–5.6)
Mean Plasma Glucose: 134.11 mg/dL

## 2018-11-21 MED FILL — Perflutren Lipid Microsphere IV Susp 1.1 MG/ML: INTRAVENOUS | Qty: 10 | Status: AC

## 2018-11-21 NOTE — Plan of Care (Signed)

## 2018-11-21 NOTE — Progress Notes (Signed)
Pt ordered pizza. Advised pt that he is on Heart healthy diet. Pt refused education and said he still was going to eat the pizza. Will continue to monitor pt.

## 2018-11-21 NOTE — Progress Notes (Signed)
Lower extremity venous duplex has been completed.   Preliminary results in CV Proc.   Abram Sander 11/21/2018 10:35 AM

## 2018-11-21 NOTE — Plan of Care (Signed)

## 2018-11-21 NOTE — Progress Notes (Signed)
PROGRESS NOTE    Leslie Atkinson  EPP:[id number] DOB: 08-08-1971 DOA: 11/19/2018 PCP: Soyla Dryer, PA-C    Brief Narrative:  48 year old male who presented with worsening lower extremity edema.  He does have significant past medical history for hypertension. Reported worsening lower extremity edema for the last 7 days prior to hospitalization, his symptoms were refractive to oral furosemide 20 mg daily for 5 days.  His symptoms were associated with dyspnea, orthopnea, decrease physical functional capacity, abdominal and scrotal edema.  On his initial physical examination his blood pressure was 151/86, heart rate 99, respiratory 22, temperature 98.2 F, oxygen saturation 91% his lungs had no rales or rhonchi, heart S1-S2 present and rhythmic, no gallops rubs or murmurs, abdomen was distended, protuberant, positive lower extremity edema.  Patient was admitted to the hospital with a working diagnosis of worsening lower extremity edema, anasarca to rule out heart failure.   Assessment & Plan:   Principal Problem:   Anasarca Active Problems:   OSA (obstructive sleep apnea): Probable   Essential hypertension   1. Acute on chronic diastolic heart failure. Will continue aggressive diuresis with furosemide, patient continue to be hypervolemic, will follow on urine output. Over last 24 H urine output 6,400 ml. Stable blood pressure with systolic 606.   2. HTN. Continue blood pressure control with lisinopril.   3. Obesity. BMI 66, possible pulmonary HTN and sleep apnea.     DVT prophylaxis: enoxaparin   Code Status: full Family Communication: no family at the bedside  Disposition Plan/ discharge barriers: pending clinical improvement.   Body mass index is 66.02 kg/m. Malnutrition Type:      Malnutrition Characteristics:      Nutrition Interventions:     RN Pressure Injury Documentation:     Consultants:     Procedures:     Antimicrobials:        Subjective: Patient continue to have significant edema in his lower extremities and scrotum, dyspnea has improved but still not back to baseline. He does have difficulties sleeping, he has been told to have apnea events.   Objective: Vitals:   11/21/18 0052 11/21/18 0243 11/21/18 0640 11/21/18 0849  BP: 120/81 128/80 129/88 130/71  Pulse: (!) 109  96 100  Resp: 18 18 18    Temp: 98.4 F (36.9 C)  97.7 F (36.5 C)   TempSrc: Oral  Oral   SpO2: 92% 94% 92%   Weight:      Height:        Intake/Output Summary (Last 24 hours) at 11/21/2018 1105 Last data filed at 11/21/2018 0900 Gross per 24 hour  Intake 243 ml  Output 7600 ml  Net -7357 ml   Filed Weights   11/20/18 0200 11/20/18 1500 11/21/18 0048  Weight: (!) 176.9 kg (!) 191.8 kg (!) 191.2 kg    Examination:   General: Not in pain or dyspnea, deconditioned  Neurology: Awake and alert, non focal  E ENT: mild pallor, no icterus, oral mucosa moist Cardiovascular: No JVD. S1-S2 present, rhythmic, no gallops, rubs, or murmurs. +++ pitting bilateral lower extremity edema. Pulmonary:  Positive breath sounds bilaterally, adequate air movement, no wheezing, rhonchi or rales. Gastrointestinal. Abdomen protuberant with, no organomegaly, non tender, no rebound or guarding Skin. No rashes Musculoskeletal: no joint deformities Severe scrotal edema.     Data Reviewed: I have personally reviewed following labs and imaging studies  CBC: Recent Labs  Lab 11/19/18 2131 11/20/18 0254  WBC 9.6 9.3  HGB 15.2 15.0  HCT 51.8 51.2  MCV 103.4* 103.4*  PLT 207 383   Basic Metabolic Panel: Recent Labs  Lab 11/19/18 2131 11/20/18 0254 11/21/18 0409  NA 142 140 140  K 4.7 4.2 4.1  CL 100 97* 90*  CO2 35* 31 37*  GLUCOSE 115* 176* 135*  BUN 15 15 16   CREATININE 1.12 1.05 1.01  CALCIUM 8.5* 8.2* 8.7*  MG  --  1.7 2.0   GFR: Estimated Creatinine Clearance: 148.5 mL/min (by C-G formula based on SCr of 1.01 mg/dL). Liver  Function Tests: Recent Labs  Lab 11/19/18 2131  AST 15  ALT 9  ALKPHOS 55  BILITOT 0.4  PROT 5.9*  ALBUMIN 2.7*   No results for input(s): LIPASE, AMYLASE in the last 168 hours. No results for input(s): AMMONIA in the last 168 hours. Coagulation Profile: No results for input(s): INR, PROTIME in the last 168 hours. Cardiac Enzymes: No results for input(s): CKTOTAL, CKMB, CKMBINDEX, TROPONINI in the last 168 hours. BNP (last 3 results) No results for input(s): PROBNP in the last 8760 hours. HbA1C: Recent Labs    11/21/18 0409  HGBA1C 6.3*   CBG: No results for input(s): GLUCAP in the last 168 hours. Lipid Profile: Recent Labs    11/21/18 0409  CHOL 139  HDL 40*  LDLCALC 61  TRIG 190*  CHOLHDL 3.5   Thyroid Function Tests: Recent Labs    11/20/18 0254  TSH 3.122   Anemia Panel: No results for input(s): VITAMINB12, FOLATE, FERRITIN, TIBC, IRON, RETICCTPCT in the last 72 hours.    Radiology Studies: I have reviewed all of the imaging during this hospital visit personally     Scheduled Meds: . enoxaparin (LOVENOX) injection  80 mg Subcutaneous Q24H  . furosemide  60 mg Intravenous Q12H  . ipratropium-albuterol  3 mL Nebulization Once  . lisinopril  20 mg Oral Daily  . [START ON 11/22/2018] multivitamin with minerals  1 tablet Oral Once per day on Mon Thu  . sodium chloride flush  3 mL Intravenous Once   Continuous Infusions:   LOS: 2 days         Gerome Apley, MD

## 2018-11-22 LAB — BASIC METABOLIC PANEL
Anion gap: 12 (ref 5–15)
BUN: 18 mg/dL (ref 6–20)
CHLORIDE: 93 mmol/L — AB (ref 98–111)
CO2: 35 mmol/L — ABNORMAL HIGH (ref 22–32)
Calcium: 8.9 mg/dL (ref 8.9–10.3)
Creatinine, Ser: 1 mg/dL (ref 0.61–1.24)
GFR calc Af Amer: >60 mL/min (ref >60)
GFR calc non Af Amer: >60 mL/min (ref >60)
GLUCOSE: 124 mg/dL — AB (ref 70–99)
Potassium: 4.7 mmol/L (ref 3.5–5.1)
Sodium: 140 mmol/L (ref 135–145)

## 2018-11-22 NOTE — Progress Notes (Signed)
PROGRESS NOTE    Leslie Atkinson  OBS:[id number] DOB: [birth year] DOA: 11/19/2018 PCP: Soyla Dryer, PA-C    Brief Narrative:  48 year old male who presented with worsening lower extremity edema.  He does have significant past medical history for hypertension. Reported worsening lower extremity edema for the last 7 days prior to hospitalization, his symptoms were refractive to oral furosemide 20 mg daily for 5 days.  His symptoms were associated with dyspnea, orthopnea, decrease physical functional capacity, abdominal and scrotal edema.  On his initial physical examination his blood pressure was 151/86, heart rate 99, respiratory 22, temperature 98.2 F, oxygen saturation 91% his lungs had no rales or rhonchi, heart S1-S2 present and rhythmic, no gallops rubs or murmurs, abdomen was distended, protuberant, positive lower extremity edema.  Patient was admitted to the hospital with a working diagnosis of worsening lower extremity edema, anasarca to rule out heart failure.    Assessment & Plan:   Principal Problem:   Anasarca Active Problems:   OSA (obstructive sleep apnea): Probable   Essential hypertension  1. Acute on chronic diastolic heart failure. Patient has not been compliant with salt restriction, continue with aggressive diuresis with IV furosemide, urine output over last 24 H 4,425 with a net negative fluid balance of 12,787 since admission. He continues to have lower extremity and scrotal edema. Patient has been remained about salt and fluid restriction.   2. HTN.  Blood pressure control with lisinopril, systolic blood pressure 476 mmHg.   3. Obesity. BMI 66, will consult nutrition for education.  4. Suspected obstructive sleep apnea. Continue nocturnal oxygen while hospitalized, will need outpatient sleep study.      DVT prophylaxis: enoxaparin   Code Status: full Family Communication: no family at the bedside  Disposition Plan/ discharge barriers: pending  clinical improvement  Body mass index is 65.33 kg/m. Malnutrition Type:      Malnutrition Characteristics:      Nutrition Interventions:     RN Pressure Injury Documentation:     Consultants:     Procedures:     Antimicrobials:       Subjective: Patient with persistent edema in his lower extremities and scrotum, improved but not back to baseline. Non compliant with salt restriction, he ordered pizza yesterday.   Objective: Vitals:   11/21/18 2102 11/22/18 0429 11/22/18 0429 11/22/18 0855  BP: 130/87  117/75 131/78  Pulse: 92  (!) 118 (!) 108  Resp: 18  18   Temp: 97.8 F (36.6 C)  98.4 F (36.9 C)   TempSrc: Oral  Oral   SpO2: 91%  95%   Weight:  (!) 189.2 kg    Height:        Intake/Output Summary (Last 24 hours) at 11/22/2018 1020 Last data filed at 11/22/2018 0739 Gross per 24 hour  Intake 480 ml  Output 3850 ml  Net -3370 ml   Filed Weights   11/20/18 1500 11/21/18 0048 11/22/18 0429  Weight: (!) 191.8 kg (!) 191.2 kg (!) 189.2 kg    Examination:   General: deconditioned, not in dyspnea or in pain.  Neurology: Awake and alert, non focal  E ENT: no pallor, no icterus, oral mucosa moist Cardiovascular: No JVD. S1-S2 present, rhythmic, no gallops, rubs, or murmurs. +++ pitting lower extremity edema. Pulmonary: positive breath sounds bilaterally, decreased air movement, no wheezing, rhonchi or rales. Gastrointestinal. Abdomen protuberant with no organomegaly, non tender, no rebound or guarding Skin. No rashes Musculoskeletal: no joint deformities Positive significant scrotal edema.  Data Reviewed: I have personally reviewed following labs and imaging studies  CBC: Recent Labs  Lab 11/19/18 2131 11/20/18 0254  WBC 9.6 9.3  HGB 15.2 15.0  HCT 51.8 51.2  MCV 103.4* 103.4*  PLT 207 106   Basic Metabolic Panel: Recent Labs  Lab 11/19/18 2131 11/20/18 0254 11/21/18 0409 11/22/18 0533  NA 142 140 140 140  K 4.7 4.2 4.1 4.7    CL 100 97* 90* 93*  CO2 35* 31 37* 35*  GLUCOSE 115* 176* 135* 124*  BUN 15 15 16 18   CREATININE 1.12 1.05 1.01 1.00  CALCIUM 8.5* 8.2* 8.7* 8.9  MG  --  1.7 2.0  --    GFR: Estimated Creatinine Clearance: 148.9 mL/min (by C-G formula based on SCr of 1 mg/dL). Liver Function Tests: Recent Labs  Lab 11/19/18 2131  AST 15  ALT 9  ALKPHOS 55  BILITOT 0.4  PROT 5.9*  ALBUMIN 2.7*   No results for input(s): LIPASE, AMYLASE in the last 168 hours. No results for input(s): AMMONIA in the last 168 hours. Coagulation Profile: No results for input(s): INR, PROTIME in the last 168 hours. Cardiac Enzymes: No results for input(s): CKTOTAL, CKMB, CKMBINDEX, TROPONINI in the last 168 hours. BNP (last 3 results) No results for input(s): PROBNP in the last 8760 hours. HbA1C: Recent Labs    11/21/18 0409  HGBA1C 6.3*   CBG: No results for input(s): GLUCAP in the last 168 hours. Lipid Profile: Recent Labs    11/21/18 0409  CHOL 139  HDL 40*  LDLCALC 61  TRIG 190*  CHOLHDL 3.5   Thyroid Function Tests: Recent Labs    11/20/18 0254  TSH 3.122   Anemia Panel: No results for input(s): VITAMINB12, FOLATE, FERRITIN, TIBC, IRON, RETICCTPCT in the last 72 hours.    Radiology Studies: I have reviewed all of the imaging during this hospital visit personally     Scheduled Meds: . enoxaparin (LOVENOX) injection  80 mg Subcutaneous Q24H  . furosemide  60 mg Intravenous Q12H  . ipratropium-albuterol  3 mL Nebulization Once  . lisinopril  20 mg Oral Daily  . multivitamin with minerals  1 tablet Oral Once per day on Mon Thu  . sodium chloride flush  3 mL Intravenous Once   Continuous Infusions:   LOS: 3 days        Traveion Ruddock Gerome Apley, MD

## 2018-11-22 NOTE — Progress Notes (Signed)
Patient at this time no longer needs PIV started Floor RN already placed

## 2018-11-23 DIAGNOSIS — I5033 Acute on chronic diastolic (congestive) heart failure: Secondary | ICD-10-CM

## 2018-11-23 LAB — BASIC METABOLIC PANEL
Anion gap: 10 (ref 5–15)
BUN: 17 mg/dL (ref 6–20)
CO2: 36 mmol/L — ABNORMAL HIGH (ref 22–32)
CREATININE: 0.91 mg/dL (ref 0.61–1.24)
Calcium: 8.6 mg/dL — ABNORMAL LOW (ref 8.9–10.3)
Chloride: 92 mmol/L — ABNORMAL LOW (ref 98–111)
GFR calc non Af Amer: >60 mL/min (ref >60)
Glucose, Bld: 112 mg/dL — ABNORMAL HIGH (ref 70–99)
Potassium: 5.7 mmol/L — ABNORMAL HIGH (ref 3.5–5.1)
Sodium: 138 mmol/L (ref 135–145)

## 2018-11-23 MED ORDER — ENOXAPARIN SODIUM 100 MG/ML ~~LOC~~ SOLN
90.0000 mg | SUBCUTANEOUS | Status: DC
  Administered 2018-11-23: 90 mg via SUBCUTANEOUS
  Filled 2018-11-23: qty 1

## 2018-11-23 MED ORDER — BISACODYL 5 MG PO TBEC
10.0000 mg | DELAYED_RELEASE_TABLET | Freq: Once | ORAL | Status: AC
  Administered 2018-11-23: 10 mg via ORAL
  Filled 2018-11-23: qty 2

## 2018-11-23 NOTE — Progress Notes (Signed)
PROGRESS NOTE    Leslie Atkinson  KAJ:[id number] DOB: Jun 01, 1971 DOA: 11/19/2018 PCP: Soyla Dryer, PA-C    Brief Narrative:  48 year old male who presented with worsening lower extremity edema. He does have significant past medical history for hypertension. Reported worsening lower extremity edema for the last 7 days prior to hospitalization, his symptoms were refractive to oral furosemide 20 mg dailyfor 5 days. His symptoms were associated with dyspnea, orthopnea, decrease physical functional capacity, abdominal and scrotal edema.On his initial physical examination his blood pressure was 151/86, heart rate 99, respiratory 22, temperature 98.2 F, oxygen saturation 91% his lungs had no rales or rhonchi, heart S1-S2 present and rhythmic, no gallops rubs or murmurs, abdomen was distended, protuberant, positive lower extremity edema.  Patient was admitted to the hospital with a working diagnosis of worsening lower extremity edema, anasarca to rule out heart failure.   Assessment & Plan:   Principal Problem:   Anasarca Active Problems:   OSA (obstructive sleep apnea): Probable   Essential hypertension  1. Acute on chronic diastolic heart failure. Uine output over last 24 H 5..175 ml with a net negative fluid balance of 16,197 ml since admission. Will continue current dose of furosemide, continue blood pressure control with lisinopril. Patient will need outpatient follow up. Considering diastolic nature of his heart failure not certain if B blockade will add benefit.   2. HTN.  Blood pressure 137/92, will continue lisinopril.   3. Obesity. BMI 66, will need close follow up as outpatient.  4. Suspected obstructive sleep apnea. Will order nocturnal oxymetry, may need home 02 at night, also will need outpatient sleep study.      DVT prophylaxis:enoxaparin Code Status:full Family Communication:no family at the bedside Disposition Plan/ discharge barriers:pending  clinical improvement   Body mass index is 64.42 kg/m. Malnutrition Type:      Malnutrition Characteristics:      Nutrition Interventions:     RN Pressure Injury Documentation:     Consultants:     Procedures:     Antimicrobials:       Subjective: Patient reports improvement of his lower extremity edema and scrotal edema, but not back to baseline. He has been more compliant with salt and fluids restrictions.   Objective: Vitals:   11/22/18 2018 11/23/18 0100 11/23/18 0452 11/23/18 0627  BP: 97/60  (!) 114/59   Pulse: 90  (!) 102   Resp: 18  20   Temp: 97.6 F (36.4 C)  (!) 97.5 F (36.4 C)   TempSrc: Oral  Oral   SpO2: 91%  (!) 85% 91%  Weight:  (!) 186.6 kg    Height:        Intake/Output Summary (Last 24 hours) at 11/23/2018 0908 Last data filed at 11/23/2018 0700 Gross per 24 hour  Intake 480 ml  Output 4550 ml  Net -4070 ml   Filed Weights   11/21/18 0048 11/22/18 0429 11/23/18 0100  Weight: (!) 191.2 kg (!) 189.2 kg (!) 186.6 kg    Examination:   General: deconditioned  Neurology: Awake and alert, non focal  E ENT: mild pallor, no icterus, oral mucosa moist Cardiovascular: No JVD. S1-S2 present, rhythmic, no gallops, rubs, or murmurs. +++ pitting lower extremity edema. Pulmonary: positive breath sounds bilaterally, adequate air movement, no wheezing, rhonchi or rales. Gastrointestinal. Abdomen protuberant with no organomegaly, non tender, no rebound or guarding Skin. No rashes Musculoskeletal: no joint deformities Positive scrotal edema.     Data Reviewed: I have personally reviewed following labs and  imaging studies  CBC: Recent Labs  Lab 11/19/18 2131 11/20/18 0254  WBC 9.6 9.3  HGB 15.2 15.0  HCT 51.8 51.2  MCV 103.4* 103.4*  PLT 207 300   Basic Metabolic Panel: Recent Labs  Lab 11/19/18 2131 11/20/18 0254 11/21/18 0409 11/22/18 0533 11/23/18 0307  NA 142 140 140 140 138  K 4.7 4.2 4.1 4.7 5.7*  CL 100 97* 90*  93* 92*  CO2 35* 31 37* 35* 36*  GLUCOSE 115* 176* 135* 124* 112*  BUN 15 15 16 18 17   CREATININE 1.12 1.05 1.01 1.00 0.91  CALCIUM 8.5* 8.2* 8.7* 8.9 8.6*  MG  --  1.7 2.0  --   --    GFR: Estimated Creatinine Clearance: 162.2 mL/min (by C-G formula based on SCr of 0.91 mg/dL). Liver Function Tests: Recent Labs  Lab 11/19/18 2131  AST 15  ALT 9  ALKPHOS 55  BILITOT 0.4  PROT 5.9*  ALBUMIN 2.7*   No results for input(s): LIPASE, AMYLASE in the last 168 hours. No results for input(s): AMMONIA in the last 168 hours. Coagulation Profile: No results for input(s): INR, PROTIME in the last 168 hours. Cardiac Enzymes: No results for input(s): CKTOTAL, CKMB, CKMBINDEX, TROPONINI in the last 168 hours. BNP (last 3 results) No results for input(s): PROBNP in the last 8760 hours. HbA1C: Recent Labs    11/21/18 0409  HGBA1C 6.3*   CBG: No results for input(s): GLUCAP in the last 168 hours. Lipid Profile: Recent Labs    11/21/18 0409  CHOL 139  HDL 40*  LDLCALC 61  TRIG 190*  CHOLHDL 3.5   Thyroid Function Tests: No results for input(s): TSH, T4TOTAL, FREET4, T3FREE, THYROIDAB in the last 72 hours. Anemia Panel: No results for input(s): VITAMINB12, FOLATE, FERRITIN, TIBC, IRON, RETICCTPCT in the last 72 hours.    Radiology Studies: I have reviewed all of the imaging during this hospital visit personally     Scheduled Meds: . enoxaparin (LOVENOX) injection  90 mg Subcutaneous Q24H  . furosemide  60 mg Intravenous Q12H  . ipratropium-albuterol  3 mL Nebulization Once  . lisinopril  20 mg Oral Daily  . multivitamin with minerals  1 tablet Oral Once per day on Mon Thu  . sodium chloride flush  3 mL Intravenous Once   Continuous Infusions:   LOS: 4 days         Gerome Apley, MD

## 2018-11-23 NOTE — Care Management Note (Addendum)
Case Management Note  Patient Details  Name: Leslie Atkinson MRN: [id number] Date of Birth: 1971-06-21  Subjective/Objective:     CHF              Action/Plan: Patient lives at home; PCP: Soyla Dryer, PA-C - [facility] [place]; he gets his medication there also; CM talked to patient about ordering [name]; he stated that " that pizza with that thin crust and that little bit of cheese could not hurt me." CM talked to him about the large amt of sodium that it has but he does not think that what he is eating is the problem.Lots of education needed.   Expected Discharge Date:    possibly 11/27/2018              Expected Discharge Plan:  Home/Self Care  Discharge planning Services  CM Consult  Status of Service:  In process, will continue to follow  Sherrilyn Rist [phone] 11/23/2018, 11:51 AM

## 2018-11-23 NOTE — Clinical Social Work Note (Signed)
CSW acknowledges consult for patient needing PCP follow up. Will notify RNCM in progression meeting.  CSW signing off. Consult again if any social work needs arise.  Dayton Scrape, [employer]

## 2018-11-24 DIAGNOSIS — I5033 Acute on chronic diastolic (congestive) heart failure: Secondary | ICD-10-CM

## 2018-11-24 DIAGNOSIS — G4733 Obstructive sleep apnea (adult) (pediatric): Secondary | ICD-10-CM

## 2018-11-24 DIAGNOSIS — I10 Essential (primary) hypertension: Secondary | ICD-10-CM

## 2018-11-24 DIAGNOSIS — R601 Generalized edema: Secondary | ICD-10-CM

## 2018-11-24 LAB — BASIC METABOLIC PANEL
Anion gap: 11 (ref 5–15)
BUN: 20 mg/dL (ref 6–20)
CHLORIDE: 96 mmol/L — AB (ref 98–111)
CO2: 31 mmol/L (ref 22–32)
Calcium: 8.6 mg/dL — ABNORMAL LOW (ref 8.9–10.3)
Creatinine, Ser: 0.93 mg/dL (ref 0.61–1.24)
GFR calc Af Amer: >60 mL/min (ref >60)
GFR calc non Af Amer: >60 mL/min (ref >60)
Glucose, Bld: 113 mg/dL — ABNORMAL HIGH (ref 70–99)
Potassium: 4.9 mmol/L (ref 3.5–5.1)
Sodium: 138 mmol/L (ref 135–145)

## 2018-11-24 MED ORDER — LISINOPRIL 10 MG PO TABS: 10.0000 mg | ORAL_TABLET | Freq: Every day | ORAL | 0 refills | Status: AC

## 2018-11-24 MED ORDER — FUROSEMIDE 40 MG PO TABS: 40.0000 mg | ORAL_TABLET | Freq: Every day | ORAL | 0 refills | Status: AC

## 2018-11-24 MED ORDER — POTASSIUM CHLORIDE ER 10 MEQ PO TBCR: 10.0000 meq | EXTENDED_RELEASE_TABLET | Freq: Every day | ORAL | 0 refills | Status: DC

## 2018-11-24 NOTE — Discharge Summary (Signed)
Physician Discharge Summary  Leslie Atkinson ZGY:[id number] DOB: [birth year] DOA: 11/19/2018  PCP: Soyla Dryer, PA-C  Admit date: 11/19/2018 Discharge date: 11/24/2018  Admitted From: Home  Disposition:  Home   Recommendations for Outpatient Follow-up and new medication changes:  1. Follow up with Soyla Dryer in 7 days. 2. Patient has been placed on 40 mg daily of furosemide.  3. Reduced lisinopril to 10 mg daily.  4. Follow renal panel in 7 days.   Home Health: no   Equipment/Devices: no    Discharge Condition: stable CODE STATUS: full  Diet recommendation: Heart healthy with salt (2 grams) and fluid (1200 ml) fluid restriction.   Brief/Interim Summary: 48 year old male who presented with worsening lower extremity edema. He does have significant past medical history for hypertension. Reported worsening lower extremity edema for the last 7 days prior to hospitalization, his symptoms were refractive to oral furosemide 20 mg dailyfor 5 days. His symptoms were associated with dyspnea, orthopnea, decrease physical functional capacity, abdominal and scrotal edema.On his initial physical examination his blood pressure was 151/86, heart rate 99, respiratory rate 22, temperature 98.2 F, oxygen saturation 91% his lungs had no rales or rhonchi, heart S1-S2 present and rhythmic, no gallops rubs or murmurs, abdomen was distended, protuberant, positive lower extremity and scrotal edema.  Sodium 142, potassium 4.7, chloride 100, bicarb 35, glucose 115, BUN 15, creatinine 1.1,  AST 15, ALT 9, BNP 183, white count 9.6, hemoglobin 15.2, hematocrit 51.8, platelets 207.  Urinalysis specific gravity 1.005, negative protein.  CT of the abdomen with cholelithiasis, diverticulosis, fat-containing umbilical hernia, mild subcutaneous edema anterior to abdominal wall.  His chest x-ray had cardiomegaly, prominent bilateral interstitial infiltrates, EKG had sinus rhythm, right axis deviation, no significant ST  segment or T wave changes.  Patient was admitted to the hospital with a working diagnosis of worsening lower extremity edema, anasarca to rule out heart failure.  1.  Acute on chronic diastolic heart failure exacerbation, complicated by cardiogenic pulmonary edema and anasarca.  Patient was admitted to the medical ward, he was placed on remote telemetry monitor he was aggressively diuresed with IV furosemide, negative balance was achieved (-19,007 ml since admission), with significant improvement of his symptoms.  Further work-up with echocardiography showed a preserved LV systolic function 60 to 59%, with no significant valvulopathy, his inferior vena cava was dilated in size, with less than 50% respiratory variability.  At discharge he continues to have edema in his lower extremities and scrotum but is able to mobilize well.  He will continue diuresis as an outpatient, will follow-up within 7 days in the free clinic in [place].  Continue heart failure regimen with lisinopril 10 mg daily.  He was advised about salt and fluid restriction.  Lower extremity ultrasonography was negative for deep vein thrombosis.  His oxygen saturation at discharge was 100% on room air.  2.  Hypertension.  Continue blood pressure control with lisinopril.  Continue diuresis with furosemide.  3.  Morbid obesity.  Calculated BMI 66, he will need aggressive lifestyle modifications, outpatient follow-up.  4.  Suspected obstructive sleep apnea.  Patient reported daytime somnolence, abrupt awakenings at night, he was witnessed to have apnea while sleeping at home.  He will need outpatient follow-up with a sleep study.  Discharge Diagnoses:  Principal Problem:   Anasarca Active Problems:   OSA (obstructive sleep apnea): Probable   Essential hypertension   Acute on chronic diastolic CHF (congestive heart failure) [facility])    Discharge Instructions   Allergies as  of 11/24/2018      Reactions   Yellow Jacket Venom [bee  Venom] Swelling   Facial swelling (severe)   Sulfa Antibiotics Hives      Medication List    STOP taking these medications   ibuprofen 200 MG tablet Commonly known as:  ADVIL,MOTRIN     TAKE these medications   furosemide 40 MG tablet Commonly known as:  LASIX Take 1 tablet (40 mg total) by mouth daily for 30 days.   lisinopril 10 MG tablet Commonly known as:  PRINIVIL,ZESTRIL Take 1 tablet (10 mg total) by mouth daily for 30 days. What changed:    medication strength  how much to take   MULTIVITAMIN MEN Tabs Take 1 tablet by mouth 2 (two) times a week.      Follow-up Information    Soyla Dryer, PA-C Follow up.   Specialty:  Physician Assistant Why:  hospital discharge follow up, repeat cbc/bmp at hospital discharge follow up. pcp to refer patient to outpatient sleep study. Contact information: [place] [address] [phone]          Allergies  Allergen Reactions  . Yellow Jacket Venom [Bee Venom] Swelling    Facial swelling (severe)  . Sulfa Antibiotics Hives    Consultations:     Procedures/Studies: Ct Abdomen Pelvis Wo Contrast  Result Date: 11/19/2018 CLINICAL DATA:  Groin swelling for several days, no known injury, initial encounter EXAM: CT ABDOMEN AND PELVIS WITHOUT CONTRAST TECHNIQUE: Multidetector CT imaging of the abdomen and pelvis was performed following the standard protocol without IV contrast. COMPARISON:  None. FINDINGS: Lower chest: No acute abnormality. Hepatobiliary: Liver is within normal limits. The gallbladder is well distended. Gallstone is noted within the neck of the gallbladder. No significant wall thickening is noted. Pancreas: Unremarkable. No pancreatic ductal dilatation or surrounding inflammatory changes. Spleen: Normal in size without focal abnormality. Adrenals/Urinary Tract: Adrenal glands are within normal limits. Kidneys are well visualized bilaterally. No renal calculi or urinary tract  obstructive changes are noted. Bladder is well distended. Stomach/Bowel: Mild diverticular change of the colon is noted. No evidence of diverticulitis is seen. The appendix is within normal limits. No small bowel abnormality is noted. The stomach is decompressed. Vascular/Lymphatic: Retroaortic left renal vein is noted. No aortic calcifications are seen. No significant lymphadenopathy is noted. Reproductive: Prostate is unremarkable. Other: No ascites is noted. Fat containing umbilical hernia is noted. No evidence of incarceration is seen Musculoskeletal: Degenerative changes of lumbar spine are noted. No acute bony abnormality is noted. Mild edematous changes are noted in the anterior abdominal wall IMPRESSION: Cholelithiasis without complicating factors. Diverticulosis without diverticulitis. Fat containing umbilical hernia. Mild subcutaneous edema in the anterior abdominal wall. Electronically Signed   By: Inez Catalina M.D.   On: 11/19/2018 23:45   Dg Chest 2 View  Result Date: 11/19/2018 CLINICAL DATA:  48 y/o M; shortness of breath and increased heart rate. EXAM: CHEST - 2 VIEW COMPARISON:  11/16/2017 chest radiograph FINDINGS: Stable mildly enlarged cardiac silhouette given projection and technique. Basilar predominant reticular opacities of the lungs. Pulmonary venous hypertension. No consolidation, effusion, or pneumothorax. IMPRESSION: Pulmonary vascular congestion. Stable enlarged cardiac silhouette given projection and technique. Electronically Signed   By: Kristine Garbe M.D.   On: 11/19/2018 23:45   Vas [place] Lower Extremity Venous (dvt)  Result Date: 11/21/2018  Lower Venous Study Indications: Edema.  Performing Technologist: Abram Sander RVS  Examination Guidelines: A complete evaluation includes B-mode imaging, spectral Doppler, color Doppler,  and power Doppler as needed of all accessible portions of each vessel. Bilateral testing is considered an integral part of a complete  examination. Limited examinations for reoccurring indications may be performed as noted.  Right Venous Findings: +---------+---------------+---------+-----------+----------+--------------+          CompressibilityPhasicitySpontaneityPropertiesSummary        +---------+---------------+---------+-----------+----------+--------------+ CFV      Full           Yes      Yes                                 +---------+---------------+---------+-----------+----------+--------------+ SFJ      Full                                                        +---------+---------------+---------+-----------+----------+--------------+ FV Prox  Full                                                        +---------+---------------+---------+-----------+----------+--------------+ FV Mid   Full                                                        +---------+---------------+---------+-----------+----------+--------------+ FV DistalFull                                                        +---------+---------------+---------+-----------+----------+--------------+ PFV      Full                                                        +---------+---------------+---------+-----------+----------+--------------+ POP      Full           Yes      Yes                                 +---------+---------------+---------+-----------+----------+--------------+ PTV      Full                                                        +---------+---------------+---------+-----------+----------+--------------+ PERO                                                  Not visualized +---------+---------------+---------+-----------+----------+--------------+  Left Venous Findings: +---------+---------------+---------+-----------+----------+--------------+  CompressibilityPhasicitySpontaneityPropertiesSummary         +---------+---------------+---------+-----------+----------+--------------+ CFV      Full           Yes      Yes                                 +---------+---------------+---------+-----------+----------+--------------+ SFJ      Full                                                        +---------+---------------+---------+-----------+----------+--------------+ FV Prox  Full                                                        +---------+---------------+---------+-----------+----------+--------------+ FV Mid   Full                                                        +---------+---------------+---------+-----------+----------+--------------+ FV DistalFull                                                        +---------+---------------+---------+-----------+----------+--------------+ PFV      Full                                                        +---------+---------------+---------+-----------+----------+--------------+ POP      Full           Yes      Yes                                 +---------+---------------+---------+-----------+----------+--------------+ PTV      Full                                                        +---------+---------------+---------+-----------+----------+--------------+ PERO                                                  Not visualized +---------+---------------+---------+-----------+----------+--------------+    Summary: Right: There is no evidence of deep vein thrombosis in the lower extremity. No cystic structure found in the popliteal fossa. Left: There is no evidence of deep vein thrombosis in the lower extremity. No cystic structure found in the popliteal fossa.  *See table(s) above for measurements and observations. Electronically  signed by Curt Jews MD on 11/21/2018 at 3:10:11 PM.    Final        Subjective: Patient is feeling better, no nausea or vomiting, his dyspnea has resolved, no chest  pain, edema continue to improve and patient is able to ambulate well.   Discharge Exam: Vitals:   11/24/18 0538 11/24/18 0741  BP: 120/85 108/70  Pulse: (!) 106 (!) 105  Resp: 18   Temp: 97.8 F (36.6 C)   SpO2: (!) 82%    Vitals:   11/23/18 1131 11/23/18 2014 11/24/18 0538 11/24/18 0741  BP: (!) 137/92 113/72 120/85 108/70  Pulse: 95 94 (!) 106 (!) 105  Resp: 19 18 18    Temp: 98.7 F (37.1 C) 98 F (36.7 C) 97.8 F (36.6 C)   TempSrc: Oral Oral Oral   SpO2: 92% 90% (!) 82%   Weight:   (!) 183.7 kg   Height:        General: Not in pain or dyspnea.  Neurology: Awake and alert, non focal  E ENT: mild pallor, no icterus, oral mucosa moist Cardiovascular: No JVD. S1-S2 present, rhythmic, no gallops, rubs, or murmurs. ++ non pitting lower extremity edema. Pulmonary: vesicular breath sounds bilaterally, adequate air movement, no wheezing, rhonchi or rales. Gastrointestinal. Abdomen protuberant with no organomegaly, non tender, no rebound or guarding Skin. No rashes Musculoskeletal: no joint deformities Positive scrotal edema.   The results of significant diagnostics from this hospitalization (including imaging, microbiology, ancillary and laboratory) are listed below for reference.     Microbiology: No results found for this or any previous visit (from the past 240 hour(s)).   Labs: BNP (last 3 results) Recent Labs    11/19/18 2131  BNP 284.1*   Basic Metabolic Panel: Recent Labs  Lab 11/20/18 0254 11/21/18 0409 11/22/18 0533 11/23/18 0307 11/24/18 0705  NA 140 140 140 138 138  K 4.2 4.1 4.7 5.7* 4.9  CL 97* 90* 93* 92* 96*  CO2 31 37* 35* 36* 31  GLUCOSE 176* 135* 124* 112* 113*  BUN 15 16 18 17 20   CREATININE 1.05 1.01 1.00 0.91 0.93  CALCIUM 8.2* 8.7* 8.9 8.6* 8.6*  MG 1.7 2.0  --   --   --    Liver Function Tests: Recent Labs  Lab 11/19/18 2131  AST 15  ALT 9  ALKPHOS 55  BILITOT 0.4  PROT 5.9*  ALBUMIN 2.7*   No results for input(s):  LIPASE, AMYLASE in the last 168 hours. No results for input(s): AMMONIA in the last 168 hours. CBC: Recent Labs  Lab 11/19/18 2131 11/20/18 0254  WBC 9.6 9.3  HGB 15.2 15.0  HCT 51.8 51.2  MCV 103.4* 103.4*  PLT 207 221   Cardiac Enzymes: No results for input(s): CKTOTAL, CKMB, CKMBINDEX, TROPONINI in the last 168 hours. BNP: Invalid input(s): POCBNP CBG: No results for input(s): GLUCAP in the last 168 hours. D-Dimer No results for input(s): DDIMER in the last 72 hours. Hgb A1c No results for input(s): HGBA1C in the last 72 hours. Lipid Profile No results for input(s): CHOL, HDL, LDLCALC, TRIG, CHOLHDL, LDLDIRECT in the last 72 hours. Thyroid function studies No results for input(s): TSH, T4TOTAL, T3FREE, THYROIDAB in the last 72 hours.  Invalid input(s): FREET3 Anemia work up No results for input(s): VITAMINB12, FOLATE, FERRITIN, TIBC, IRON, RETICCTPCT in the last 72 hours. Urinalysis    Component Value Date/Time   COLORURINE COLORLESS (A) 11/20/2018 0330   APPEARANCEUR CLEAR 11/20/2018 0330   LABSPEC  1.005 11/20/2018 0330   PHURINE 5.0 11/20/2018 0330   GLUCOSEU NEGATIVE 11/20/2018 0330   HGBUR NEGATIVE 11/20/2018 0330   BILIRUBINUR NEGATIVE 11/20/2018 0330   KETONESUR NEGATIVE 11/20/2018 0330   PROTEINUR NEGATIVE 11/20/2018 0330   NITRITE NEGATIVE 11/20/2018 0330   LEUKOCYTESUR NEGATIVE 11/20/2018 0330   Sepsis Labs Invalid input(s): PROCALCITONIN,  WBC,  LACTICIDVEN Microbiology No results found for this or any previous visit (from the past 240 hour(s)).   Time coordinating discharge: 45 minutes  SIGNED:   Tawni Millers, MD  [facility] 11/24/2018, 12:16 PM

## 2018-12-26 ENCOUNTER — Ambulatory Visit: Payer: Self-pay | Admitting: Physician Assistant

## 2019-01-09 DEATH — deceased

## 2020-11-16 IMAGING — CR DG CHEST 2V
3 series · 3 of 3 positions shown · non-contrast
Comparison: 11/16/2017 chest radiograph

CLINICAL DATA: 47 y/o M; shortness of breath and increased heart
rate.

EXAM:
CHEST - 2 VIEW

[chest ap (1 of 2)]
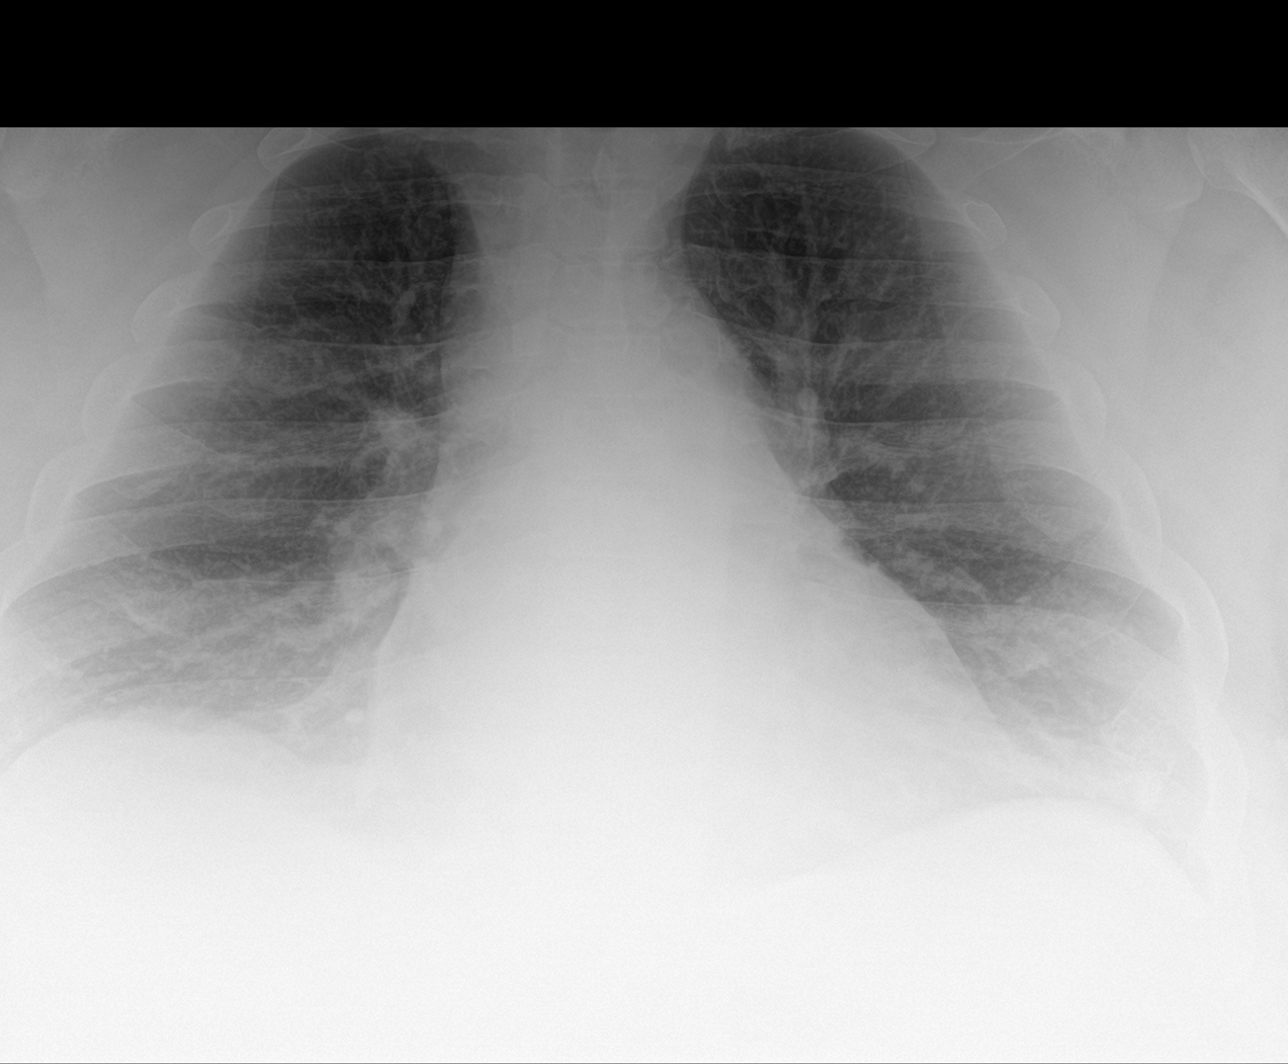

[chest lat]
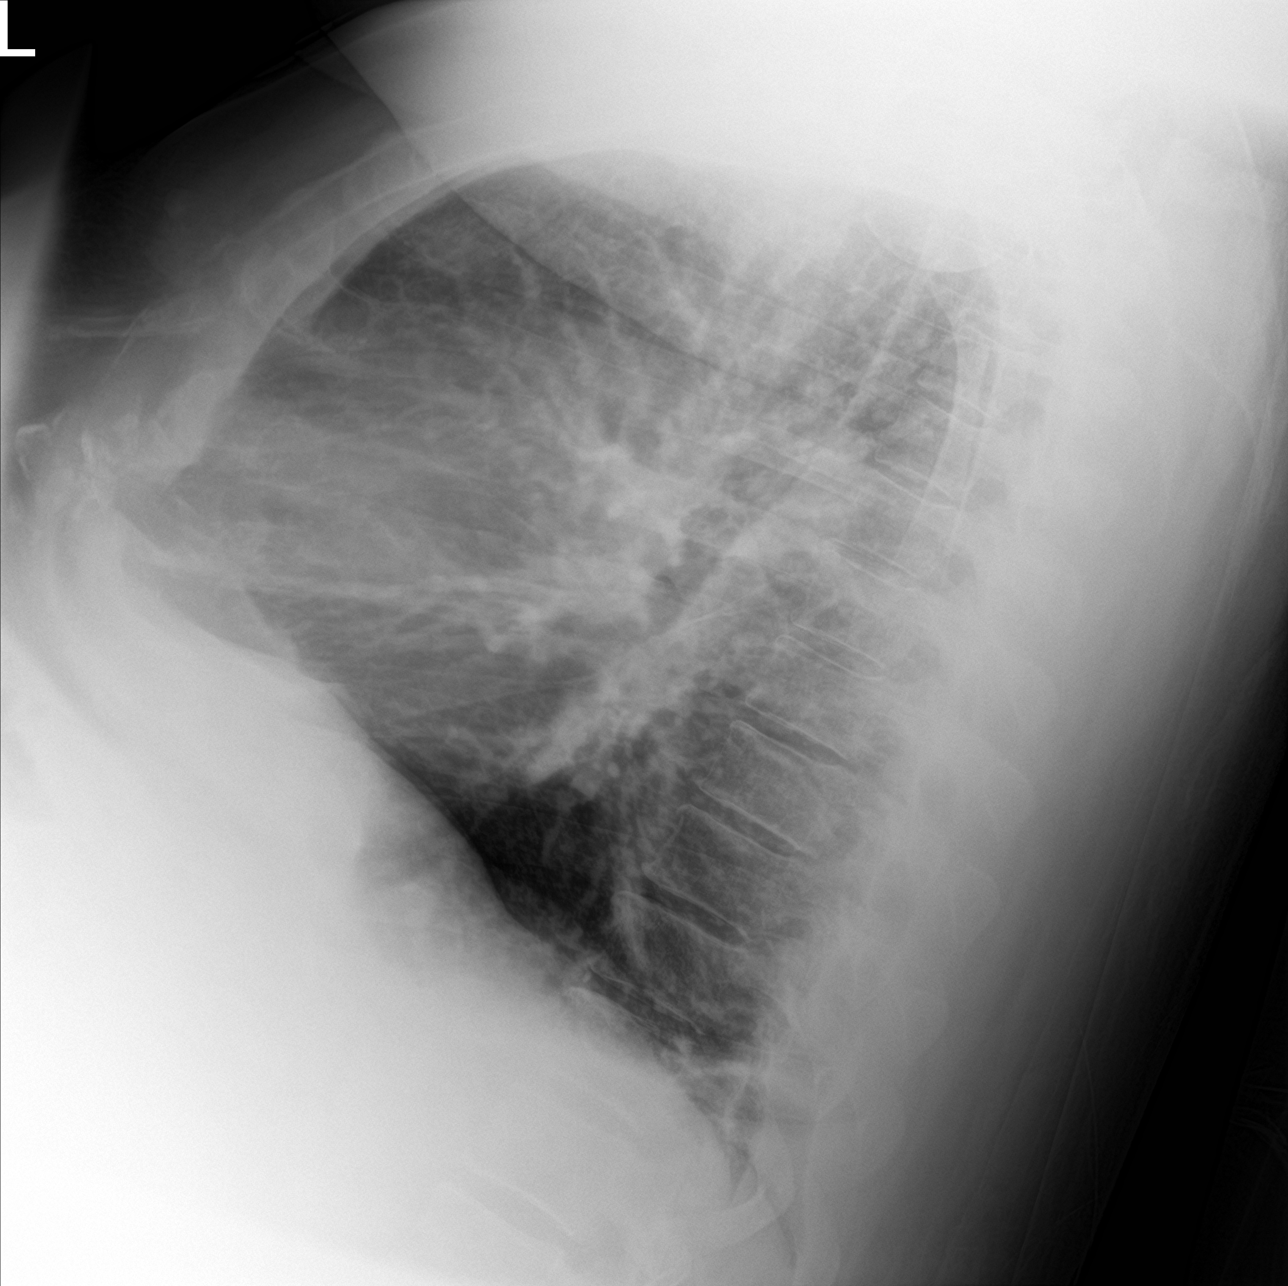

[chest ap (2 of 2)]
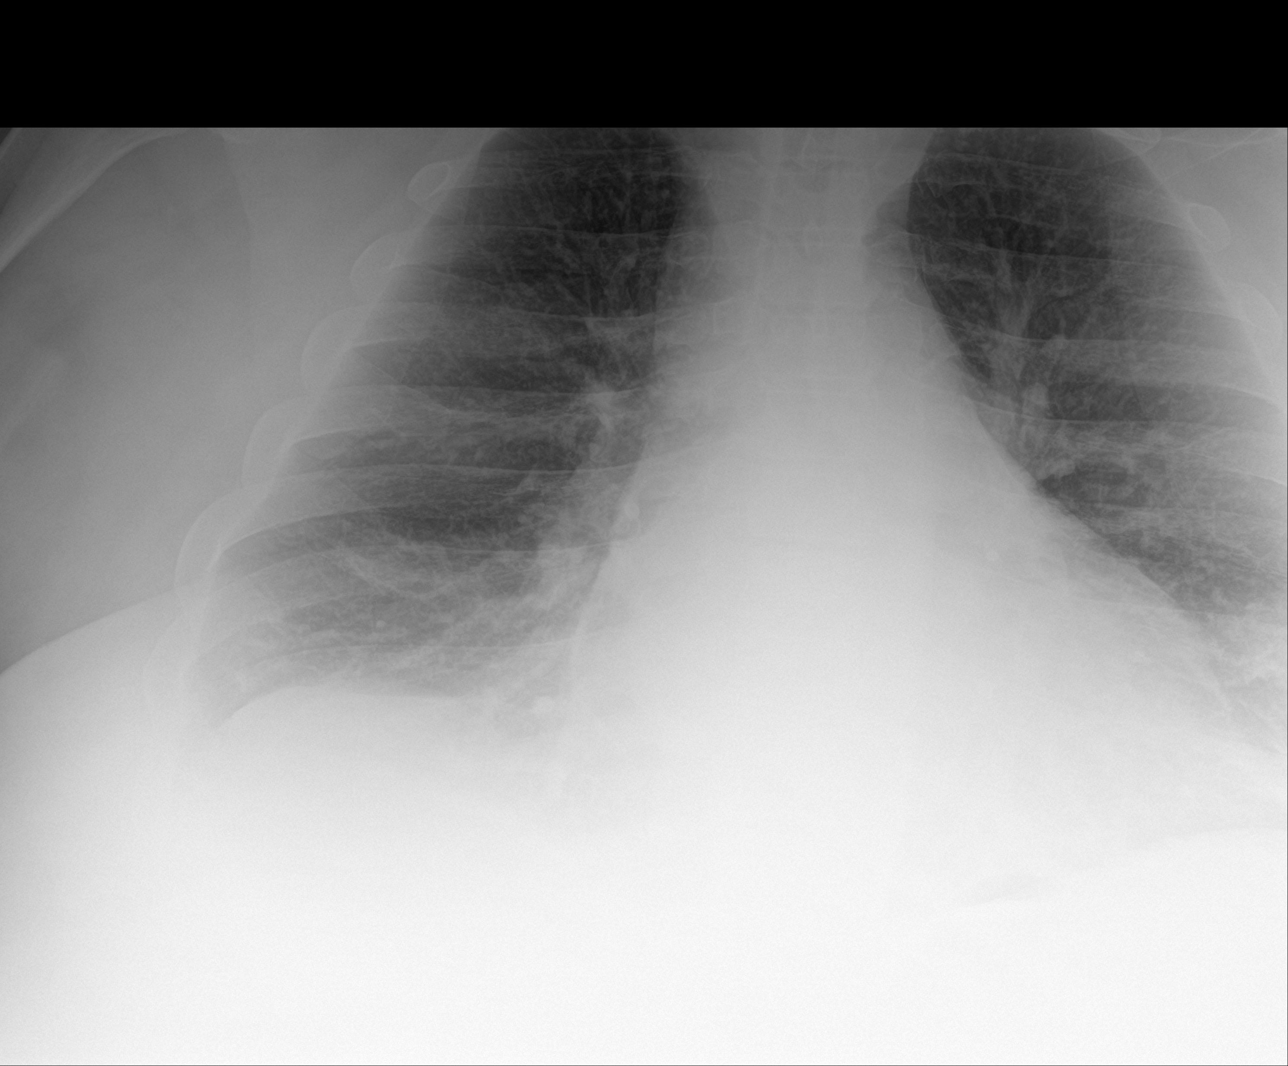

[3 of 3 positions shown; findings below may reference images not displayed]

FINDINGS: Stable mildly enlarged cardiac silhouette given projection and
technique. Basilar predominant reticular opacities of the lungs.
Pulmonary venous hypertension. No consolidation, effusion, or
pneumothorax.
IMPRESSION: Pulmonary vascular congestion. Stable enlarged cardiac silhouette
given projection and technique.
